# Patient Record
Sex: Female | Born: 1995 | Race: Asian | Hispanic: No | Marital: Married | Smoking: Never smoker
Health system: Southern US, Community
[De-identification: ages and names within clinical notes are randomized; demographics above are authoritative.]

## PROBLEM LIST (undated history)

## (undated) ENCOUNTER — Inpatient Hospital Stay (HOSPITAL_COMMUNITY): Payer: Self-pay

## (undated) DIAGNOSIS — Z789 Other specified health status: Secondary | ICD-10-CM

## (undated) HISTORY — PX: NO PAST SURGERIES: SHX2092

---
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
## 2016-09-26 ENCOUNTER — Ambulatory Visit (INDEPENDENT_AMBULATORY_CARE_PROVIDER_SITE_OTHER): Payer: BLUE CROSS/BLUE SHIELD | Admitting: Physician Assistant

## 2016-09-26 ENCOUNTER — Encounter: Payer: Self-pay | Admitting: Physician Assistant

## 2016-09-26 VITALS — BP 110/71 | HR 82 | Temp 98.1°F | Resp 16 | Wt 116.2 lb

## 2016-09-26 DIAGNOSIS — R300 Dysuria: Secondary | ICD-10-CM | POA: Diagnosis not present

## 2016-09-26 DIAGNOSIS — N390 Urinary tract infection, site not specified: Secondary | ICD-10-CM | POA: Diagnosis not present

## 2016-09-26 DIAGNOSIS — R319 Hematuria, unspecified: Secondary | ICD-10-CM | POA: Diagnosis not present

## 2016-09-26 LAB — POCT URINALYSIS DIP (MANUAL ENTRY)
BILIRUBIN UA: NEGATIVE
BILIRUBIN UA: NEGATIVE mg/dL
Glucose, UA: NEGATIVE mg/dL
Nitrite, UA: POSITIVE — AB
PH UA: 6.5 (ref 5.0–8.0)
Protein Ur, POC: 30 mg/dL — AB
Spec Grav, UA: >=1.03 — AB (ref 1.010–1.025)
Urobilinogen, UA: 0.2 E.U./dL

## 2016-09-26 LAB — POC MICROSCOPIC URINALYSIS (UMFC): Mucus: ABSENT

## 2016-09-26 MED ORDER — NITROFURANTOIN MONOHYD MACRO 100 MG PO CAPS: 100.0000 mg | ORAL_CAPSULE | Freq: Two times a day (BID) | ORAL | 0 refills | Status: AC

## 2016-09-26 MED ORDER — PHENAZOPYRIDINE HCL 200 MG PO TABS: 200.0000 mg | ORAL_TABLET | Freq: Three times a day (TID) | ORAL | 0 refills | Status: DC | PRN

## 2016-09-26 NOTE — Progress Notes (Signed)
09/26/2016 at 9:29 AM  Carrie Boyd / DOB: 09-Jan-1996 / MRN: [id number]  The patient  does not have a problem list on file.  SUBJECTIVE  Carrie Boyd is a 21 y.o. female who complains of dysuria, hematuria, urinary frequency, urinary hesitancy, and suprapubic pressure x 4 weeks. She denies flank pain, pelvic pain, cloudy malordorous urine, genital rash, genital irritation and vaginal discharge. Has not tried anything with no relief. Has never had a UTI.    She  has no past medical history on file.    Medications reviewed and updated by myself where necessary, and exist elsewhere in the encounter.   Carrie Boyd has No Known Allergies. She  reports that she has never smoked. She has never used smokeless tobacco. She reports that she does not drink alcohol or use drugs. She  has no sexual activity history on file. The patient  has no past surgical history on file.  Her family history is not on file.  Review of Systems  Constitutional: Negative for chills, diaphoresis and fever.  Gastrointestinal: Negative for nausea and vomiting.    OBJECTIVE  Her  weight is 116 lb 3.2 oz (52.7 kg). Her oral temperature is 98.1 F (36.7 C). Her blood pressure is 110/71 and her pulse is 82. Her respiration is 16 and oxygen saturation is 100%.  The patient's body mass index is unknown because there is no height or weight on file.  Physical Exam  Constitutional: She is oriented to person, place, and time. She appears well-developed and well-nourished. No distress.  HENT:  Head: Normocephalic and atraumatic.  Eyes: Conjunctivae are normal.  Neck: Normal range of motion.  Respiratory: Effort normal.  GI: Soft. Normal appearance. She exhibits no distension. There is tenderness (mild ) in the suprapubic area. There is no rigidity, no guarding, no tenderness at McBurney's point and negative Murphy's sign.  Neurological: She is alert and oriented to person, place, and time.  Skin: Skin is warm and dry.  Psychiatric: She has  a normal mood and affect.    Results for orders placed or performed in visit on 09/26/16 (from the past 24 hour(s))  POCT urinalysis dipstick     Status: Abnormal   Collection Time: 09/26/16  9:02 AM  Result Value Ref Range   Color, UA yellow yellow   Clarity, UA cloudy (A) clear   Glucose, UA negative negative mg/dL   Bilirubin, UA negative negative   Ketones, POC UA negative negative mg/dL   Spec Grav, UA >=4.098 (A) 1.010 - 1.025   Blood, UA moderate (A) negative   pH, UA 6.5 5.0 - 8.0   Protein Ur, POC =30 (A) negative mg/dL   Urobilinogen, UA 0.2 0.2 or 1.0 E.U./dL   Nitrite, UA Positive (A) Negative   Leukocytes, UA Trace (A) Negative  POCT Microscopic Urinalysis ([facility])     Status: Abnormal   Collection Time: 09/26/16  9:14 AM  Result Value Ref Range   WBC,UR,HPF,POC Many (A) None WBC/hpf   RBC,UR,HPF,POC Moderate (A) None RBC/hpf   Bacteria Many (A) None, Too numerous to count   Mucus Absent Absent   Epithelial Cells, UR Per Microscopy Few (A) None, Too numerous to count cells/hpf    ASSESSMENT & PLAN  Carrie was seen today for dysuria and urinary frequency.  Diagnoses and all orders for this visit:  Dysuria -     POCT urinalysis dipstick -     POCT Microscopic Urinalysis ([facility]) -     Urine Culture -  phenazopyridine (PYRIDIUM) 200 MG tablet; Take 1 tablet (200 mg total) by mouth 3 (three) times daily as needed for pain.  Urinary tract infection with hematuria, site unspecified -     nitrofurantoin, macrocrystal-monohydrate, (MACROBID) 100 MG capsule; Take 1 capsule (100 mg total) by mouth 2 (two) times daily.    The patient was advised to call or come back to clinic if she does not see an improvement in symptoms, or worsens with the above plan.   Benjiman CoreBrittany Attila Mccarthy, PA-C [facility] 09/26/2016 9:29 AM

## 2016-09-26 NOTE — Patient Instructions (Addendum)
Your results indicate you have a UTI. I have given you a prescription for an antibiotic. Please take with food. I have sent off a urine culture and we should have those results in 48 hours. If your symptoms worsen while you are awaiting these results or you develop fever, chills, flank pian, nausea and vomiting, please seek care immediately. I have also given you a tablet to use for the burning. This medication can make your urine turn orange, so do not be alarmed if this happens.    Urinary Tract Infection, Adult A urinary tract infection (UTI) is an infection of any part of the urinary tract. The urinary tract includes the:  Kidneys.  Ureters.  Bladder.  Urethra.  These organs make, store, and get rid of pee (urine) in the body. Follow these instructions at home:  Take over-the-counter and prescription medicines only as told by your doctor.  If you were prescribed an antibiotic medicine, take it as told by your doctor. Do not stop taking the antibiotic even if you start to feel better.  Avoid the following drinks: ? Alcohol. ? Caffeine. ? Tea. ? Carbonated drinks.  Drink enough fluid to keep your pee clear or pale yellow.  Keep all follow-up visits as told by your doctor. This is important.  Make sure to: ? Empty your bladder often and completely. Do not to hold pee for long periods of time. ? Empty your bladder before and after sex. ? Wipe from front to back after a bowel movement if you are female. Use each tissue one time when you wipe. Contact a doctor if:  You have back pain.  You have a fever.  You feel sick to your stomach (nauseous).  You throw up (vomit).  Your symptoms do not get better after 3 days.  Your symptoms go away and then come back. Get help right away if:  You have very bad back pain.  You have very bad lower belly (abdominal) pain.  You are throwing up and cannot keep down any medicines or water. This information is not intended to  replace advice given to you by your health care provider. Make sure you discuss any questions you have with your health care provider. Document Released: 08/20/2007 Document Revised: 08/09/2015 Document Reviewed: 01/22/2015 Elsevier Interactive Patient Education  2018 [employer]   IF you received an x-ray today, you will receive an invoice from [facility]. Please contact [facility] at [phone] with questions or concerns regarding your invoice.   IF you received labwork today, you will receive an invoice from [facility]. Please contact [facility] at [phone] with questions or concerns regarding your invoice.   Our billing staff will not be able to assist you with questions regarding bills from these companies.  You will be contacted with the lab results as soon as they are available. The fastest way to get your results is to activate your My Chart account. Instructions are located on the last page of this paperwork. If you have not heard from us regarding the results in 2 weeks, please contact this office.

## 2016-09-30 LAB — URINE CULTURE

## 2016-11-14 ENCOUNTER — Ambulatory Visit (INDEPENDENT_AMBULATORY_CARE_PROVIDER_SITE_OTHER): Payer: BLUE CROSS/BLUE SHIELD | Admitting: Physician Assistant

## 2016-11-14 ENCOUNTER — Encounter: Payer: Self-pay | Admitting: Physician Assistant

## 2016-11-14 VITALS — BP 107/71 | HR 91 | Temp 97.6°F | Resp 16 | Wt 116.0 lb

## 2016-11-14 DIAGNOSIS — R8299 Other abnormal findings in urine: Secondary | ICD-10-CM

## 2016-11-14 DIAGNOSIS — R319 Hematuria, unspecified: Secondary | ICD-10-CM

## 2016-11-14 DIAGNOSIS — N390 Urinary tract infection, site not specified: Secondary | ICD-10-CM | POA: Diagnosis not present

## 2016-11-14 DIAGNOSIS — R350 Frequency of micturition: Secondary | ICD-10-CM | POA: Diagnosis not present

## 2016-11-14 DIAGNOSIS — R300 Dysuria: Secondary | ICD-10-CM

## 2016-11-14 DIAGNOSIS — R82998 Other abnormal findings in urine: Secondary | ICD-10-CM

## 2016-11-14 LAB — POCT URINALYSIS DIP (MANUAL ENTRY)
Glucose, UA: 100 mg/dL — AB
Nitrite, UA: POSITIVE — AB
Protein Ur, POC: >=300 mg/dL — AB
Spec Grav, UA: >=1.03 — AB (ref 1.010–1.025)
Urobilinogen, UA: 2 U/dL — AB
pH, UA: 6.5 (ref 5.0–8.0)

## 2016-11-14 LAB — POC MICROSCOPIC URINALYSIS (UMFC): Mucus: ABSENT

## 2016-11-14 MED ORDER — NITROFURANTOIN MONOHYD MACRO 100 MG PO CAPS: 100.0000 mg | ORAL_CAPSULE | Freq: Two times a day (BID) | ORAL | 0 refills | Status: DC

## 2016-11-14 NOTE — Progress Notes (Signed)
11/14/2016 at 5:54 PM  Carrie Boyd / DOB: 01-May-1995 / MRN: [id number]  The patient  does not have a problem list on file.  SUBJECTIVE  Carrie Boyd is a 21 y.o. female who complains of dysuria, hematuria, urinary frequency, urinary urgency and suprapubic abdominal pain x 4 days. She denies flank pain and vaginal discharge. Has tried Azo and probiotics with cranberry with no relief. Most recent UTI prior to this was 09/26/2016. Urine culture grew E.coli, which was sensitive to macrobid. Pt is sexually active with monogamous partner. Just started engaging in sexual activity in 02/2016. Never had UTIs prior to this. States she uses the bathroom consistently after engaging in sexual intercourse. Denies use of spermicides or lubricants.   She  has no past medical history on file.    Medications reviewed and updated by myself where necessary, and exist elsewhere in the encounter.   Carrie Boyd has No Known Allergies. She  reports that she has never smoked. She has never used smokeless tobacco. She reports that she does not drink alcohol or use drugs. She  has no sexual activity history on file. The patient  has no past surgical history on file.  Her family history is not on file.  Review of Systems  Constitutional: Negative for chills, diaphoresis and fever.  Gastrointestinal: Negative for nausea and vomiting.    OBJECTIVE  Her  weight is 116 lb (52.6 kg). Her oral temperature is 97.6 F (36.4 C). Her blood pressure is 107/71 and her pulse is 91. Her respiration is 16 and oxygen saturation is 100%.  The patient's body mass index is unknown because there is no height or weight on file.  Physical Exam  Constitutional: She is oriented to person, place, and time. She appears well-developed and well-nourished.  HENT:  Head: Normocephalic and atraumatic.  Eyes: Conjunctivae are normal.  Neck: Normal range of motion.  Pulmonary/Chest: Effort normal.  Abdominal: Soft. Normal appearance. There is tenderness  (mild) in the suprapubic area. There is no CVA tenderness.  Neurological: She is alert and oriented to person, place, and time.  Skin: Skin is warm and dry.  Psychiatric: She has a normal mood and affect.  Vitals reviewed.   Results for orders placed or performed in visit on 11/14/16 (from the past 24 hour(s))  POCT urinalysis dipstick     Status: Abnormal   Collection Time: 11/14/16  3:58 PM  Result Value Ref Range   Color, UA orange (A) yellow   Clarity, UA clear clear   Glucose, UA =100 (A) negative mg/dL   Bilirubin, UA small (A) negative   Ketones, POC UA trace (5) (A) negative mg/dL   Spec Grav, UA >=6.962 (A) 1.010 - 1.025   Blood, UA large (A) negative   pH, UA 6.5 5.0 - 8.0   Protein Ur, POC >=300 (A) negative mg/dL   Urobilinogen, UA 2.0 (A) 0.2 or 1.0 E.U./dL   Nitrite, UA Positive (A) Negative   Leukocytes, UA Large (3+) (A) Negative  POCT Microscopic Urinalysis ([facility])     Status: Abnormal   Collection Time: 11/14/16  4:04 PM  Result Value Ref Range   WBC,UR,HPF,POC Too numerous to count  (A) None WBC/hpf   RBC,UR,HPF,POC Many (A) None RBC/hpf   Bacteria Few (A) None, Too numerous to count   Mucus Absent Absent   Epithelial Cells, UR Per Microscopy Few (A) None, Too numerous to count cells/hpf   ASSESSMENT & PLAN  Carrie was seen today for dysuria and  hematuria.  Diagnoses and all orders for this visit:  Dysuria -     POCT urinalysis dipstick -     POCT Microscopic Urinalysis ([facility]) -     Urine Culture  Urinary frequency  Leukocytes in urine -     Urine Culture  Urinary tract infection with hematuria, site unspecified -     nitrofurantoin, macrocrystal-monohydrate, (MACROBID) 100 MG capsule; Take 1 capsule (100 mg total) by mouth 2 (two) times daily.    Due to hx and POCT, will treat empirically for UTI at this time. Urine culture pending. Last urine cx grew E.coli sensitive to macrobid. Will treat with longer duration of macrobid at this time. Plan to  follow up in 4 days for repeat UA while on medication to ensure appropriate healing. The patient was advised to call or come back to clinic if she does not see an improvement in symptoms, or worsens with the above plan. If pt has one more documented UTI this year, consider prophylactic tx.   Benjiman CoreBrittany Natelie Ostrosky, PA-C  [facility] 11/14/2016 5:57 PM

## 2016-11-14 NOTE — Patient Instructions (Addendum)
Your results indicate you have a UTI. I have given you a prescription for an antibiotic for a longer duration than last time. Please take with food. I have sent off a urine culture and we should have those results in 48 hours. I would like you to return on Tuesday of next week for repeat urinalysis to ensure this is clearing. If your symptoms worsen while you are awaiting these results or you develop fever, chills, flank pian, nausea and vomiting, please seek care immediately. If you have another UTI this year, may consider doing prophylactic treatment Thank you for letting me participate in your health and well being.  Urinary Tract Infection, Adult A urinary tract infection (UTI) is an infection of any part of the urinary tract. The urinary tract includes the:  Kidneys.  Ureters.  Bladder.  Urethra.  These organs make, store, and get rid of pee (urine) in the body. Follow these instructions at home:  Take over-the-counter and prescription medicines only as told by your doctor.  If you were prescribed an antibiotic medicine, take it as told by your doctor. Do not stop taking the antibiotic even if you start to feel better.  Avoid the following drinks: ? Alcohol. ? Caffeine. ? Tea. ? Carbonated drinks.  Drink enough fluid to keep your pee clear or pale yellow.  Keep all follow-up visits as told by your doctor. This is important.  Make sure to: ? Empty your bladder often and completely. Do not to hold pee for long periods of time. ? Empty your bladder before and after sex. ? Wipe from front to back after a bowel movement if you are female. Use each tissue one time when you wipe. Contact a doctor if:  You have back pain.  You have a fever.  You feel sick to your stomach (nauseous).  You throw up (vomit).  Your symptoms do not get better after 3 days.  Your symptoms go away and then come back. Get help right away if:  You have very bad back pain.  You have very bad  lower belly (abdominal) pain.  You are throwing up and cannot keep down any medicines or water. This information is not intended to replace advice given to you by your health care provider. Make sure you discuss any questions you have with your health care provider. Document Released: 08/20/2007 Document Revised: 08/09/2015 Document Reviewed: 01/22/2015 Elsevier Interactive Patient Education  2018 [employer]       IF you received an x-ray today, you will receive an invoice from [facility]. Please contact [facility] at [phone] with questions or concerns regarding your invoice.   IF you received labwork today, you will receive an invoice from [facility]. Please contact [facility] at [phone] with questions or concerns regarding your invoice.   Our billing staff will not be able to assist you with questions regarding bills from these companies.  You will be contacted with the lab results as soon as they are available. The fastest way to get your results is to activate your My Chart account. Instructions are located on the last page of this paperwork. If you have not heard from us regarding the results in 2 weeks, please contact this office.

## 2016-11-18 ENCOUNTER — Ambulatory Visit: Payer: BLUE CROSS/BLUE SHIELD | Admitting: Physician Assistant

## 2016-11-18 ENCOUNTER — Ambulatory Visit (INDEPENDENT_AMBULATORY_CARE_PROVIDER_SITE_OTHER): Payer: BLUE CROSS/BLUE SHIELD | Admitting: Physician Assistant

## 2016-11-18 ENCOUNTER — Encounter: Payer: Self-pay | Admitting: Physician Assistant

## 2016-11-18 VITALS — BP 99/67 | HR 69 | Temp 98.9°F | Resp 16 | Ht 63.0 in | Wt 115.4 lb

## 2016-11-18 DIAGNOSIS — N390 Urinary tract infection, site not specified: Secondary | ICD-10-CM

## 2016-11-18 DIAGNOSIS — N3001 Acute cystitis with hematuria: Secondary | ICD-10-CM | POA: Diagnosis not present

## 2016-11-18 DIAGNOSIS — R319 Hematuria, unspecified: Secondary | ICD-10-CM

## 2016-11-18 DIAGNOSIS — R300 Dysuria: Secondary | ICD-10-CM | POA: Diagnosis not present

## 2016-11-18 DIAGNOSIS — Z202 Contact with and (suspected) exposure to infections with a predominantly sexual mode of transmission: Secondary | ICD-10-CM

## 2016-11-18 LAB — POCT URINALYSIS DIP (MANUAL ENTRY)
Bilirubin, UA: NEGATIVE
Blood, UA: NEGATIVE
Glucose, UA: NEGATIVE mg/dL
Ketones, POC UA: NEGATIVE mg/dL
Nitrite, UA: NEGATIVE
Protein Ur, POC: NEGATIVE mg/dL
Spec Grav, UA: 1.02 (ref 1.010–1.025)
Urobilinogen, UA: 0.2 U/dL
pH, UA: 7 (ref 5.0–8.0)

## 2016-11-18 LAB — URINE CULTURE

## 2016-11-18 MED ORDER — NITROFURANTOIN MONOHYD MACRO 100 MG PO CAPS: 100.0000 mg | ORAL_CAPSULE | Freq: Two times a day (BID) | ORAL | 0 refills | Status: AC

## 2016-11-18 NOTE — Progress Notes (Signed)
Carrie Boyd  MRN: [id number] DOB: [birth year]  PCP: Patient, No Pcp Per  Subjective:  Pt is a 21 year old female who presents to clinic for f/u UTI.  She has been treated for two UTI's in the past month.  Initial UTI was 09/26/2016 - HPI from this OV: urine culture grew E.coli, which was sensitive to macrobid. Pt is sexually active with monogamous partner. Just started engaging in sexual activity in 02/2016. Never had UTIs prior to this. States she uses the bathroom consistently after engaging in sexual intercourse. Denies use of spermicides or lubricants.  Today she is still taking her Macrobid - 2 days left. She is starting to feel better. C/o "bladder hurt a little bit" yesterday.  She does not drink much water throughout the day. She holds her urine while at work - she works as a Advertising account planner.  She and her boyfriend recently broke up for about 3 weeks. She is unsure whether he "slept around". Denies vaginal discharge, vaginal burning or itching, flank pain, low back pain, hematuria, increased frequency.   Review of Systems  Constitutional: Negative for chills, fatigue and fever.  Gastrointestinal: Positive for abdominal pain.  Genitourinary: Positive for dysuria. Negative for decreased urine volume, difficulty urinating, enuresis, flank pain, frequency, hematuria and urgency.  Musculoskeletal: Negative for back pain.    There are no active problems to display for this patient.   Current Outpatient Prescriptions on File Prior to Visit  Medication Sig Dispense Refill  . nitrofurantoin, macrocrystal-monohydrate, (MACROBID) 100 MG capsule Take 1 capsule (100 mg total) by mouth 2 (two) times daily. 14 capsule 0   No current facility-administered medications on file prior to visit.     No Known Allergies   Objective:  BP 101/67   Pulse 66   Temp 98.9 F (37.2 C) (Oral)   Resp 16   Ht 5\' 3"  (1.6 m)   Wt 115 lb 6.4 oz (52.3 kg)   LMP 11/01/2016   SpO2 99%   BMI 20.44 kg/m    Physical Exam  Constitutional: She is oriented to person, place, and time and well-developed, well-nourished, and in no distress. No distress.  Cardiovascular: Normal rate, regular rhythm and normal heart sounds.   Abdominal: Soft. Normal appearance. There is no tenderness. There is no CVA tenderness.  Neurological: She is alert and oriented to person, place, and time. GCS score is 15.  Skin: Skin is warm and dry.  Psychiatric: Mood, memory, affect and judgment normal.  Vitals reviewed.  Results for orders placed or performed in visit on 11/18/16  POCT urinalysis dipstick  Result Value Ref Range   Color, UA yellow yellow   Clarity, UA cloudy (A) clear   Glucose, UA negative negative mg/dL   Bilirubin, UA negative negative   Ketones, POC UA negative negative mg/dL   Spec Grav, UA 4.098 1.191 - 1.025   Blood, UA negative negative   pH, UA 7.0 5.0 - 8.0   Protein Ur, POC negative negative mg/dL   Urobilinogen, UA 0.2 0.2 or 1.0 E.U./dL   Nitrite, UA Negative Negative   Leukocytes, UA Trace (A) Negative    Assessment and Plan :  1. Dysuria 2. Urinary tract infection with hematuria, site unspecified - POCT urinalysis dipstick - Urine Culture - nitrofurantoin, macrocrystal-monohydrate, (MACROBID) 100 MG capsule; Take 1 capsule (100 mg total) by mouth 2 (two) times daily.  Dispense: 8 capsule; Refill: 0 - Pt has h/o 2 UTI's within the past month. She is here  today for f/u from UTI 4 days ago. She is feeling mostly better. UA shows trace leukocytes. Will extend her antibiotics a few more days. As per PA Brittany's last note: If pt has one more documented UTI this year, consider prophylactic tx. This was discussed with pt. She understands and will RTC if she develops UTI symptoms again.  3. Possible exposure to STD - GC/Chlamydia Probe Amp - HIV antibody (with reflex) - Labs are pending. Will contact with results and plan. Safe sex practices advised.   Marco CollieWhitney Aleksa Collinsworth, PA-C  Primary  Care at [facility] 11/18/2016 10:07 AM

## 2016-11-18 NOTE — Patient Instructions (Addendum)
Take 4 more days of your antibiotic. Take this as prescribed.  Drink 1-2 liters of water daily. Please see instructions below to help reduce your likelihood for UTI.  Start taking a probiotic daily.  Continue urinating after you have intercourse.  Come back if your symptoms return. We will likely start a prophylactic antibiotic at that time.   Thank you for coming in today. I hope you feel we met your needs.  Feel free to call PCP  if you have any questions or further requests.  Please consider signing up for MyChart if you do not already have it, as this is a great way to communicate with me.  Best,  Whitney McVey, PA-C    Urinary Tract Infection, Adult A urinary tract infection (UTI) is an infection of any part of the urinary tract, which includes the kidneys, ureters, bladder, and urethra. These organs make, store, and get rid of urine in the body. UTI can be a bladder infection (cystitis) or kidney infection (pyelonephritis). What are the causes? This infection may be caused by fungi, viruses, or bacteria. Bacteria are the most common cause of UTIs. This condition can also be caused by repeated incomplete emptying of the bladder during urination. What increases the risk? This condition is more likely to develop if:  You ignore your need to urinate or hold urine for long periods of time.  You do not empty your bladder completely during urination.  You wipe back to front after urinating or having a bowel movement, if you are female.  You are uncircumcised, if you are female.  You are constipated.  You have a urinary catheter that stays in place (indwelling).  You have a weak defense (immune) system.  You have a medical condition that affects your bowels, kidneys, or bladder.  You have diabetes.  You take antibiotic medicines frequently or for long periods of time, and the antibiotics no longer work well against certain types of infections (antibiotic resistance).  You take  medicines that irritate your urinary tract.  You are exposed to chemicals that irritate your urinary tract.  You are female.  What are the signs or symptoms? Symptoms of this condition include:  Fever.  Frequent urination or passing small amounts of urine frequently.  Needing to urinate urgently.  Pain or burning with urination.  Urine that smells bad or unusual.  Cloudy urine.  Pain in the lower abdomen or back.  Trouble urinating.  Blood in the urine.  Vomiting or being less hungry than normal.  Diarrhea or abdominal pain.  Vaginal discharge, if you are female.  How is this diagnosed? This condition is diagnosed with a medical history and physical exam. You will also need to provide a urine sample to test your urine. Other tests may be done, including:  Blood tests.  Sexually transmitted disease (STD) testing.  If you have had more than one UTI, a cystoscopy or imaging studies may be done to determine the cause of the infections. How is this treated? Treatment for this condition often includes a combination of two or more of the following:  Antibiotic medicine.  Other medicines to treat less common causes of UTI.  Over-the-counter medicines to treat pain.  Drinking enough water to stay hydrated.  Follow these instructions at home:  Take over-the-counter and prescription medicines only as told by your health care provider.  If you were prescribed an antibiotic, take it as told by your health care provider. Do not stop taking the antibiotic even  if you start to feel better.  Avoid alcohol, caffeine, tea, and carbonated beverages. They can irritate your bladder.  Drink enough fluid to keep your urine clear or pale yellow.  Keep all follow-up visits as told by your health care provider. This is important.  Make sure to: ? Empty your bladder often and completely. Do not hold urine for long periods of time. ? Empty your bladder before and after  sex. ? Wipe from front to back after a bowel movement if you are female. Use each tissue one time when you wipe. Contact a health care provider if:  You have back pain.  You have a fever.  You feel nauseous or vomit.  Your symptoms do not get better after 3 days.  Your symptoms go away and then return. Get help right away if:  You have severe back pain or lower abdominal pain.  You are vomiting and cannot keep down any medicines or water. This information is not intended to replace advice given to you by your health care provider. Make sure you discuss any questions you have with your health care provider. Document Released: 12/11/2004 Document Revised: 08/15/2015 Document Reviewed: 01/22/2015 Elsevier Interactive Patient Education  2017 [employer].  IF you received an x-ray today, you will receive an invoice from [facility]. Please contact [facility] at [phone] with questions or concerns regarding your invoice.   IF you received labwork today, you will receive an invoice from [facility]. Please contact [facility] at [phone] with questions or concerns regarding your invoice.   Our billing staff will not be able to assist you with questions regarding bills from these companies.  You will be contacted with the lab results as soon as they are available. The fastest way to get your results is to activate your My Chart account. Instructions are located on the last page of this paperwork. If you have not heard from [place] regarding the results in 2 weeks, please contact this office.

## 2016-11-19 LAB — HIV ANTIBODY (ROUTINE TESTING W REFLEX): HIV Screen 4th Generation wRfx: NONREACTIVE

## 2016-11-19 LAB — GC/CHLAMYDIA PROBE AMP
Chlamydia trachomatis, NAA: NEGATIVE
Neisseria gonorrhoeae by PCR: NEGATIVE

## 2016-11-19 LAB — URINE CULTURE

## 2016-11-19 NOTE — Progress Notes (Signed)
Pls call pt and let her know she is negative for GC/chlamydia and HIV.  Thank you!

## 2016-11-24 ENCOUNTER — Encounter: Payer: Self-pay | Admitting: *Deleted

## 2016-12-01 ENCOUNTER — Telehealth: Payer: Self-pay | Admitting: Family Medicine

## 2016-12-01 NOTE — Telephone Encounter (Signed)
PT CALLING ABOUT LABS I GAVE HER THE RESULTS OF ALL BEING NORMAL PT UNDERSTANDS

## 2017-02-03 ENCOUNTER — Ambulatory Visit: Payer: BLUE CROSS/BLUE SHIELD | Admitting: Physician Assistant

## 2017-02-03 ENCOUNTER — Encounter: Payer: Self-pay | Admitting: Physician Assistant

## 2017-02-03 ENCOUNTER — Other Ambulatory Visit: Payer: Self-pay

## 2017-02-03 VITALS — BP 98/70 | HR 79 | Temp 98.5°F | Resp 16 | Ht 62.75 in | Wt 117.2 lb

## 2017-02-03 DIAGNOSIS — R6884 Jaw pain: Secondary | ICD-10-CM | POA: Diagnosis not present

## 2017-02-03 DIAGNOSIS — M26622 Arthralgia of left temporomandibular joint: Secondary | ICD-10-CM

## 2017-02-03 DIAGNOSIS — G4763 Sleep related bruxism: Secondary | ICD-10-CM | POA: Diagnosis not present

## 2017-02-03 MED ORDER — NAPROXEN 500 MG PO TABS: 500.0000 mg | ORAL_TABLET | Freq: Two times a day (BID) | ORAL | 0 refills | Status: DC

## 2017-02-03 MED ORDER — CYCLOBENZAPRINE HCL 10 MG PO TABS: 10.0000 mg | ORAL_TABLET | Freq: Three times a day (TID) | ORAL | 0 refills | Status: DC | PRN

## 2017-02-03 NOTE — Progress Notes (Signed)
   Carrie Boyd  MRN: [id number] DOB: 04/22/1995  PCP: Patient, No Pcp Per  Subjective:  Pt is a 21 year old female who presents to clinic for jaw pain x 5 days. Pain is on the left side in front of her ear. Radiates to her ear and toward her cheek.  Her boyfriend has heard her grind her teeth at night. Endorses pain with chewing.  She has not taken anything to feel better. This is a new problem for her. Denies drainage from mouth/teeth or ear, fever, chills, n/v, jaw locking up.   Review of Systems  Constitutional: Negative for chills, diaphoresis, fatigue and fever.  HENT: Positive for ear pain. Negative for drooling, ear discharge, facial swelling and sore throat.   Gastrointestinal: Negative for nausea and vomiting.  Musculoskeletal: Positive for arthralgias (left jaw). Negative for joint swelling.  Skin: Negative.     There are no active problems to display for this patient.   No current outpatient medications on file prior to visit.   No current facility-administered medications on file prior to visit.     No Known Allergies   Objective:  BP 98/70   Pulse 79   Temp 98.5 F (36.9 C) (Oral)   Resp 16   Ht 5' 2.75" (1.594 m)   Wt 117 lb 3.2 oz (53.2 kg)   LMP 01/03/2017   SpO2 97%   BMI 20.93 kg/m   Physical Exam  Constitutional: She is oriented to person, place, and time and well-developed, well-nourished, and in no distress. No distress.  HENT:  Head:    Right Ear: Tympanic membrane normal.  Left Ear: Tympanic membrane normal.  Mouth/Throat: Oropharynx is clear and moist and mucous membranes are normal. Normal dentition. No dental caries.    Neurological: She is alert and oriented to person, place, and time. GCS score is 15.  Skin: Skin is warm and dry.  Psychiatric: Mood, memory, affect and judgment normal.  Vitals reviewed.   Assessment and Plan :  1. TMJ tenderness, left 2. Bruxism, sleep-related 3. Jaw pain - naproxen (NAPROSYN) 500 MG tablet;  Take 1 tablet (500 mg total) by mouth 2 (two) times daily with a meal.  Dispense: 30 tablet; Refill: 0 - cyclobenzaprine (FLEXERIL) 10 MG tablet; Take 1 tablet (10 mg total) by mouth 3 (three) times daily as needed for muscle spasms.  Dispense: 30 tablet; Refill: 0 - Suspect jaw pain 2/2 nocturnal bruxism. Discussed need to stop grinding her teeth at night. Consider mouth guard and/or OV with dentist for custom guard. Advised stress releiving techniques. RTC in 2-3 weeks if no improvement. Consider tricyclic antidepressant. She understands and agrees with plan.   Marco CollieWhitney Nahsir Venezia, PA-C  Primary Care at [facility] 02/03/2017 9:53 AM

## 2017-02-03 NOTE — Patient Instructions (Addendum)
Take Flexeril and Naprosyn together. Flexeril is a muscle relaxer and Naprosyn is an antiinflammatory NSAID - Do not use with any other otc pain medication other than tylenol/acetaminophen - so no aleve, ibuprofen, motrin, advil, etc.  Consider making an appointment with your dentist to discuss this condition. You may need a custom fitted mouth guard for sleep.  Consider stress relieving techniques. TMJ is often related to stress.  Come back in 2-3 weeks if you are not improving. We can try a different medication.   Make an appointment with [place] or myself to discuss your concern about your skin. We can run some tests and/or try treatment.   Temporomandibular Joint Syndrome Temporomandibular joint (TMJ) syndrome is a condition that affects the joints between your jaw and your skull. The TMJs are located near your ears and allow your jaw to open and close. These joints and the nearby muscles are involved in all movements of the jaw. People with TMJ syndrome have pain in the area of these joints and muscles. Chewing, biting, or other movements of the jaw can be difficult or painful. TMJ syndrome can be caused by various things. In many cases, the condition is mild and goes away within a few weeks. For some people, the condition can become a long-term problem. What are the causes? Possible causes of TMJ syndrome include:  Grinding your teeth or clenching your jaw. Some people do this when they are under stress.  Arthritis.  Injury to the jaw.  Head or neck injury.  Teeth or dentures that are not aligned well.  In some cases, the cause of TMJ syndrome may not be known. What are the signs or symptoms? The most common symptom is an aching pain on the side of the head in the area of the TMJ. Other symptoms may include:  Pain when moving your jaw, such as when chewing or biting.  Being unable to open your jaw all the way.  Making a clicking sound when you open your  mouth.  Headache.  Earache.  Neck or shoulder pain.  How is this diagnosed? Diagnosis can usually be made based on your symptoms, your medical history, and a physical exam. Your health care provider may check the range of motion of your jaw. Imaging tests, such as X-rays or an MRI, are sometimes done. You may need to see your dentist to determine if your teeth and jaw are lined up correctly. How is this treated? TMJ syndrome often goes away on its own. If treatment is needed, the options may include:  Eating soft foods and applying ice or heat.  Medicines to relieve pain or inflammation.  Medicines to relax the muscles.  A splint, bite plate, or mouthpiece to prevent teeth grinding or jaw clenching.  Relaxation techniques or counseling to help reduce stress.  Transcutaneous electrical nerve stimulation (TENS). This helps to relieve pain by applying an electrical current through the skin.  Acupuncture. This is sometimes helpful to relieve pain.  Jaw surgery. This is rarely needed.  Follow these instructions at home:  Take medicines only as directed by your health care provider.  Eat a soft diet if you are having trouble chewing.  Apply ice to the painful area. ? Put ice in a plastic bag. ? Place a towel between your skin and the bag. ? Leave the ice on for 20 minutes, 2-3 times a day.  Apply a warm compress to the painful area as directed.  Massage your jaw area and perform any  jaw stretching exercises as recommended by your health care provider.  If you were given a mouthpiece or bite plate, wear it as directed.  Avoid foods that require a lot of chewing. Do not chew gum.  Keep all follow-up visits as directed by your health care provider. This is important. Contact a health care provider if:  You are having trouble eating.  You have new or worsening symptoms. Get help right away if:  Your jaw locks open or closed. This information is not intended to replace  advice given to you by your health care provider. Make sure you discuss any questions you have with your health care provider. Document Released: 11/26/2000 Document Revised: 11/01/2015 Document Reviewed: 10/06/2013 Elsevier Interactive Patient Education  [employer].  Thank you for coming in today. I hope you feel we met your needs.  Feel free to call PCP if you have any questions or further requests.  Please consider signing up for MyChart if you do not already have it, as this is a great way to communicate with me.  Best,  Whitney McVey, PA-C   IF you received an x-ray today, you will receive an invoice from [facility]. Please contact [facility] at [phone] with questions or concerns regarding your invoice.   IF you received labwork today, you will receive an invoice from [place]. Please contact [facility] at [phone] with questions or concerns regarding your invoice.   Our billing staff will not be able to assist you with questions regarding bills from these companies.  You will be contacted with the lab results as soon as they are available. The fastest way to get your results is to activate your My Chart account. Instructions are located on the last page of this paperwork. If you have not heard from [place] regarding the results in 2 weeks, please contact this office.

## 2017-02-23 ENCOUNTER — Ambulatory Visit: Payer: BLUE CROSS/BLUE SHIELD | Admitting: Physician Assistant

## 2017-03-02 ENCOUNTER — Ambulatory Visit: Payer: BLUE CROSS/BLUE SHIELD | Admitting: Physician Assistant

## 2017-03-02 ENCOUNTER — Other Ambulatory Visit: Payer: Self-pay

## 2017-03-02 ENCOUNTER — Encounter: Payer: Self-pay | Admitting: Physician Assistant

## 2017-03-02 VITALS — BP 108/60 | HR 93 | Temp 98.0°F | Resp 18 | Ht 63.39 in | Wt 114.4 lb

## 2017-03-02 DIAGNOSIS — R21 Rash and other nonspecific skin eruption: Secondary | ICD-10-CM | POA: Diagnosis not present

## 2017-03-02 DIAGNOSIS — B360 Pityriasis versicolor: Secondary | ICD-10-CM

## 2017-03-02 DIAGNOSIS — N926 Irregular menstruation, unspecified: Secondary | ICD-10-CM | POA: Diagnosis not present

## 2017-03-02 LAB — POCT SKIN KOH: Skin KOH, POC: NEGATIVE

## 2017-03-02 LAB — POCT URINE PREGNANCY: PREG TEST UR: NEGATIVE

## 2017-03-02 MED ORDER — SELENIUM SULFIDE 2.25 % EX SHAM: 1.0000 "application " | MEDICATED_SHAMPOO | Freq: Every day | CUTANEOUS | 0 refills | Status: DC

## 2017-03-02 NOTE — Progress Notes (Deleted)
Subjective:     Carrie Boyd is a 21 y.o. female who presents for evaluation of a rash involving the back. Rash started 6 days ago when she got here from [place]. Lesions are white and flaky in texture. Rash has changed over time. Rash is pruritic. Associated symptoms: none. Patient denies: abdominal pain, arthralgia, congestion, cough, decrease in appetite, decrease in energy level, fever, headache, irritability, myalgia, nausea, sore throat and vomiting. Patient has not had contacts with similar rash. Patient has not had new exposures (soaps, lotions, laundry detergents, foods, medications, plants, insects or animals).  {Common ambulatory SmartLinks:19316}  Review of Systems {ros; complete:30496}    Objective:    BP 108/60 (BP Location: Left Arm, Patient Position: Sitting, Cuff Size: Normal)   Pulse 93   Temp 98 F (36.7 C) (Oral)   Resp 18   Ht 5' 3.39" (1.61 m)   Wt 114 lb 6.4 oz (51.9 kg)   LMP 02/02/2017 (Approximate)   SpO2 99%   BMI 20.02 kg/m  General:  {gen appearance:16600}  Skin:  {skin exam:30902::"normal"}     Assessment:    {derm diagnosis:16511}    Plan:    {ZOXW:96045}{plan:18774}

## 2017-03-02 NOTE — Progress Notes (Signed)
Carrie Boyd  MRN: [id number] DOB: 04/06/1995  Subjective:     Carrie Boyd is a 21 y.o. female who presents for evaluation of a rash involving the back. Rash started 6 days ago when she got here from [place]. Lesions are white and flaky in texture. Rash has changed over time. Rash is pruritic. Associated symptoms: none. Patient denies: abdominal pain, arthralgia, congestion, cough, decrease in appetite, decrease in energy level, fever, headache, irritability, myalgia, nausea, sore throat and vomiting. Patient has not had contacts with similar rash. Patient has not had new exposures (soaps, lotions, laundry detergents, foods, medications, plants, insects or animals).  Patient would also like a pregnancy test.  Notes that she is a few days late on her cycle and she wants to make sure that she is not pregnant.  She is sexually active.  She is not on any birth control.  Has no other questions or concerns.  Review of Systems  Per HPI  There are no active problems to display for this patient.   Current Outpatient Medications on File Prior to Visit  Medication Sig Dispense Refill  . cyclobenzaprine (FLEXERIL) 10 MG tablet Take 1 tablet (10 mg total) by mouth 3 (three) times daily as needed for muscle spasms. 30 tablet 0  . naproxen (NAPROSYN) 500 MG tablet Take 1 tablet (500 mg total) by mouth 2 (two) times daily with a meal. 30 tablet 0   No current facility-administered medications on file prior to visit.     No Known Allergies      Objective:  BP 108/60 (BP Location: Left Arm, Patient Position: Sitting, Cuff Size: Normal)   Pulse 93   Temp 98 F (36.7 C) (Oral)   Resp 18   Ht 5' 3.39" (1.61 m)   Wt 114 lb 6.4 oz (51.9 kg)   LMP 02/02/2017 (Approximate)   SpO2 99%   BMI 20.02 kg/m   Physical Exam  Constitutional: She is oriented to person, place, and time and well-developed, well-nourished, and in no distress.  HENT:  Head: Normocephalic and atraumatic.  Eyes:  Conjunctivae are normal.  Neck: Normal range of motion.  Pulmonary/Chest: Effort normal.  Neurological: She is alert and oriented to person, place, and time. Gait normal.  Skin: Skin is warm and dry. Rash ( Hypopigmented macules and patches noted on anterior and posterior trunk and bilateral upper extremities. Mild scaling over some lesions. No overylying erythema. No face involvement.  ) noted.  Psychiatric: Affect normal.  Vitals reviewed.    Results for orders placed or performed in visit on 03/02/17 (from the past 24 hour(s))  POCT Skin KOH     Status: None   Collection Time: 03/02/17  5:38 PM  Result Value Ref Range   Skin KOH, POC Negative Negative  POCT urine pregnancy     Status: Normal   Collection Time: 03/02/17  6:00 PM  Result Value Ref Range   Preg Test, Ur Negative Negative    Assessment and Plan :  1. Rash and nonspecific skin eruption - POCT Skin KOH 2. Tinea versicolor History and physical exam findings consistent with tinea versicolor.  We will treat empirically with selenium sulfide shampoo at this time. Patient encouraged to return to clinic if symptoms worsen, do not improve, or as needed - Selenium Sulfide 2.25 % SHAM; Apply 1 application topically daily. Apply shampoo to affected area and lather with small amounts of water; leave on skin for 10 minutes, then rinse thoroughly; repeat once every  day for 7 days.  Dispense: 180 mL; Refill: 0  3. Late menstruation Pregnancy test negative.  Patient is interested in considering birth control.  Would like to think about options.  She would like to make a follow-up appointment with me later this week after she has had some time to think about this. Strongly encouraged her to return office for this discussion. - POCT urine pregnancy  Benjiman CoreBrittany Elick Aguilera PA-C  [facility] Care at [facility]  [facility] 03/02/2017 7:00 PM

## 2017-03-02 NOTE — Progress Notes (Deleted)
   Carrie Boyd  MRN: [id number] DOB: 05/01/1995  Subjective:  Carrie Boyd is a 21 y.o. female seen in office today for a chief complaint of ***  Review of Systems  There are no active problems to display for this patient.   Current Outpatient Medications on File Prior to Visit  Medication Sig Dispense Refill  . cyclobenzaprine (FLEXERIL) 10 MG tablet Take 1 tablet (10 mg total) by mouth 3 (three) times daily as needed for muscle spasms. 30 tablet 0  . naproxen (NAPROSYN) 500 MG tablet Take 1 tablet (500 mg total) by mouth 2 (two) times daily with a meal. 30 tablet 0   No current facility-administered medications on file prior to visit.     No Known Allergies   Objective:  There were no vitals taken for this visit.  Physical Exam  Assessment and Plan :  *** There are no diagnoses linked to this encounter.   Benjiman CoreBrittany Wiseman PA-C  Primary Care at [facility]  [facility] 03/02/2017 5:02 PM

## 2017-03-02 NOTE — Patient Instructions (Addendum)
Your history and physical exam findings are consistent with tinea versicolor.  We are going to treat this with a topical shampoo.  Below the instructions on how to do so.  Selenium shampoo (2.25%, 2.3%): Apply to affected area and lather with small amounts of water; leave on skin for 10 minutes, then rinse thoroughly; repeat once every day for 7 days. Tinea Versicolor Tinea versicolor is a skin infection that is caused by a type of yeast. It causes a rash that shows up as light or dark patches on the skin. It often occurs on the chest, back, neck, or upper arms. The condition usually does not cause other problems. In most cases, it goes away in a few weeks with treatment. The infection cannot be spread by person to another person. Follow these instructions at home:  Take medicines only as told by your doctor.  Scrub your skin every day with a dandruff shampoo as told by your doctor.  Do not scratch your skin in the rash area.  Avoid places that are hot and humid.  Do not use tanning booths.  Try to avoid sweating a lot. Contact a doctor if:  Your symptoms get worse.  You have a fever.  You have redness, swelling, or pain in the area of your rash.  You have fluid, blood, or pus coming from your rash.  Your rash comes back after treatment. This information is not intended to replace advice given to you by your health care provider. Make sure you discuss any questions you have with your health care provider. Document Released: 02/14/2008 Document Revised: 11/04/2015 Document Reviewed: 12/13/2013 Elsevier Interactive Patient Education  2018 [employer]    IF you received an x-ray today, you will receive an invoice from [facility]. Please contact [facility] at [phone] with questions or concerns regarding your invoice.   IF you received labwork today, you will receive an invoice from [facility]. Please contact [facility] at [phone] with questions or  concerns regarding your invoice.   Our billing staff will not be able to assist you with questions regarding bills from these companies.  You will be contacted with the lab results as soon as they are available. The fastest way to get your results is to activate your My Chart account. Instructions are located on the last page of this paperwork. If you have not heard from us regarding the results in 2 weeks, please contact this office.

## 2017-03-17 NOTE — L&D Delivery Note (Signed)
Operative Delivery Note At 7:49 PM a viable female was delivered via Vaginal, Spontaneous.  Presentation: vertex; Position: Left,, Occiput,, Anterior; Station: +3.  Verbal consent: obtained from family.  Risks and benefits discussed in detail.  Risks include, but are not limited to the risks of anesthesia, bleeding, infection, damage to maternal tissues, fetal cephalhematoma.  There is also the risk of inability to effect vaginal delivery of the head, or shoulder dystocia that cannot be resolved by established maneuvers, leading to the need for emergency cesarean section.  Vacuum assistance offered due to minimal descent due to poor maternal pushing effort over 2+ hours.  Kiwi used, able to bring vtx from +2 to +3, but had 3 popoffs so initially not reapplied.  She then pushed for about 20 more minutes and made some progress.  With significant caput showing, I reapplied Kiwi one last time and delivered the head with minimal effort.  NICU team present due to meconium, baby had spontaneous cry.  APGAR: 8, 9; weight pending .   Placenta status: spontaneous, intact.   Cord:  with the following complications: none.   Anesthesia:  Epidural Instruments: Kiwi Episiotomy: None Lacerations: 2nd degree Suture Repair: 3.0 vicryl rapide Est. Blood Loss (mL):    Mom to postpartum.  Baby to Couplet care / Skin to Skin.  Leighton Roachodd D Rayn Shorb 10/30/2017, 8:16 PM

## 2017-03-18 ENCOUNTER — Ambulatory Visit: Payer: BLUE CROSS/BLUE SHIELD | Admitting: Physician Assistant

## 2017-03-18 ENCOUNTER — Encounter: Payer: Self-pay | Admitting: Physician Assistant

## 2017-03-18 ENCOUNTER — Other Ambulatory Visit: Payer: Self-pay

## 2017-03-18 VITALS — BP 118/62 | HR 99 | Temp 98.1°F | Resp 18 | Ht 63.39 in | Wt 111.0 lb

## 2017-03-18 DIAGNOSIS — B360 Pityriasis versicolor: Secondary | ICD-10-CM | POA: Diagnosis not present

## 2017-03-18 DIAGNOSIS — Z3201 Encounter for pregnancy test, result positive: Secondary | ICD-10-CM

## 2017-03-18 DIAGNOSIS — R300 Dysuria: Secondary | ICD-10-CM | POA: Diagnosis not present

## 2017-03-18 DIAGNOSIS — R05 Cough: Secondary | ICD-10-CM | POA: Diagnosis not present

## 2017-03-18 DIAGNOSIS — R059 Cough, unspecified: Secondary | ICD-10-CM

## 2017-03-18 DIAGNOSIS — R112 Nausea with vomiting, unspecified: Secondary | ICD-10-CM

## 2017-03-18 DIAGNOSIS — N926 Irregular menstruation, unspecified: Secondary | ICD-10-CM

## 2017-03-18 LAB — POCT CBC
GRANULOCYTE PERCENT: 81 % — AB (ref 37–80)
HEMATOCRIT: 42.1 % (ref 37.7–47.9)
Hemoglobin: 13.3 g/dL (ref 12.2–16.2)
Lymph, poc: 1.5 (ref 0.6–3.4)
MCH, POC: 23.7 pg — AB (ref 27–31.2)
MCHC: 31.6 g/dL — AB (ref 31.8–35.4)
MCV: 75.2 fL — AB (ref 80–97)
MID (CBC): 0.3 (ref 0–0.9)
MPV: 9 fL (ref 0–99.8)
POC GRANULOCYTE: 7.5 — AB (ref 2–6.9)
POC LYMPH PERCENT: 15.8 %L (ref 10–50)
POC MID %: 3.2 % (ref 0–12)
Platelet Count, POC: 228 10*3/uL (ref 142–424)
RBC: 5.6 M/uL — AB (ref 4.04–5.48)
RDW, POC: 15 %
WBC: 9.3 10*3/uL (ref 4.6–10.2)

## 2017-03-18 LAB — POCT URINE PREGNANCY: PREG TEST UR: POSITIVE — AB

## 2017-03-18 LAB — POCT URINALYSIS DIP (MANUAL ENTRY)
Blood, UA: NEGATIVE
GLUCOSE UA: NEGATIVE mg/dL
LEUKOCYTES UA: NEGATIVE
Nitrite, UA: NEGATIVE
Protein Ur, POC: 100 mg/dL — AB
Urobilinogen, UA: 0.2 E.U./dL
pH, UA: 6 (ref 5.0–8.0)

## 2017-03-18 LAB — POC INFLUENZA A&B (BINAX/QUICKVUE)
Influenza A, POC: NEGATIVE
Influenza B, POC: NEGATIVE

## 2017-03-18 MED ORDER — DOXYLAMINE-PYRIDOXINE 10-10 MG PO TBEC: DELAYED_RELEASE_TABLET | ORAL | 0 refills | Status: DC

## 2017-03-18 MED ORDER — VITAMIN B-6 25 MG PO TABS: 25.0000 mg | ORAL_TABLET | Freq: Every day | ORAL | 0 refills | Status: DC

## 2017-03-18 MED ORDER — SODIUM CHLORIDE 0.9 % IV BOLUS (SEPSIS)
1000.0000 mL | Freq: Once | INTRAVENOUS | Status: AC
  Administered 2017-03-18: 1000 mL via INTRAVENOUS

## 2017-03-18 MED ORDER — DOXYLAMINE SUCCINATE (SLEEP) 25 MG PO TABS: 12.5000 mg | ORAL_TABLET | Freq: Every evening | ORAL | 0 refills | Status: DC | PRN

## 2017-03-18 MED ORDER — SELENIUM SULFIDE 2.25 % EX SHAM: 1.0000 "application " | MEDICATED_SHAMPOO | Freq: Every day | CUTANEOUS | 0 refills | Status: DC

## 2017-03-18 NOTE — Patient Instructions (Addendum)
Your pregnancy test did show that you are pregnant.  This is likely why you are so nauseous.  Below is some information about morning sickness.  I have given you a prescription for oral tablets to take for nausea and vomiting.  If you take this and are still having nausea vomiting that does not allow you to tolerate oral liquids or food, seek care immediately at the ED.  I have also given you a referral for gynecology to establish care.  If you are wanting to be seen at the health department, please contact them immediately and let them know that you are pregnant.  Otherwise follow-up with the gynecologist in town.  I also recommend you start a prenatal vitamin.  He can get this over-the-counter at any pharmacy. Daily prenatal vitamins containing folate (400 micrograms/day) are recommended as tolerated throughout pregnancy, and at least through the first 3 months of pregnancy. Ideally, women should start folate supplementation 12 weeks prior to conception. Selected presentations, such as a prior pregnancy complicated by fetal neural tube defect, require higher-dose folate intake of 4 mg/day. When started preconceptually and continued through pregnancy, folic acid supplementation appears to reduce risk of small-for-gestational-age neonates at birth   Morning Sickness Morning sickness is when you feel sick to your stomach (nauseous) during pregnancy. You may feel sick to your stomach and throw up (vomit). You may feel sick in the morning, but you can feel this way any time of day. Some women feel very sick to their stomach and cannot stop throwing up (hyperemesis gravidarum). Follow these instructions at home:  Only take medicines as told by your doctor.  Take multivitamins as told by your doctor. Taking multivitamins before getting pregnant can stop or lessen the harshness of morning sickness.  Eat dry toast or unsalted crackers before getting out of bed.  Eat 5 to 6 small meals a day.  Eat dry and  bland foods like rice and baked potatoes.  Do not drink liquids with meals. Drink between meals.  Do not eat greasy, fatty, or spicy foods.  Have someone cook for you if the smell of food causes you to feel sick or throw up.  If you feel sick to your stomach after taking prenatal vitamins, take them at night or with a snack.  Eat protein when you need a snack (nuts, yogurt, cheese).  Eat unsweetened gelatins for dessert.  Wear a bracelet used for sea sickness (acupressure wristband).  Go to a doctor that puts thin needles into certain body points (acupuncture) to improve how you feel.  Do not smoke.  Use a humidifier to keep the air in your house free of odors.  Get lots of fresh air. Contact a doctor if:  You need medicine to feel better.  You feel dizzy or lightheaded.  You are losing weight. Get help right away if:  You feel very sick to your stomach and cannot stop throwing up.  You pass out (faint). This information is not intended to replace advice given to you by your health care provider. Make sure you discuss any questions you have with your health care provider. Document Released: 04/10/2004 Document Revised: 08/09/2015 Document Reviewed: 08/18/2012 Elsevier Interactive Patient Education  2017 Elsevier Inc.   Folic Acid and Pregnancy What is folic acid? Folic acid is a B vitamin. Your body needs it to make new cells. Folic acid is also called folate. Folate is the form of the B vitamin that is found naturally in food. Folic acid  is the artificial (synthetic) form of the B vitamin. Folic acid is added to certain foods (fortified foods) and is also available in dietary supplements such as prenatal vitamins. All women who may become pregnant or are planning to become pregnant need at least 400-800 mcg of folic acid daily. Most pregnant women need 600-800 mcg of folic acid per day, but some women need more. What are the benefits of taking folic acid during  pregnancy? Taking folic acid during pregnancy helps to prevent abnormalities that can develop in an unborn baby's brain, spine, or spinal column (neural tube defects). These defects include:  Spina bifida. This is when the spinal column does not close completely during development, leaving the spinal cord exposed. This means the nerves that control leg movements and other bodily functions do not work. Spina bifida causes lifelong disabilities.  Anencephaly. Babies born with anencephaly have an underdeveloped brain. They may have little or no brain matter, and they could also be missing parts of the skull.  Neural tube defects occur in the first few months (first trimester) of pregnancy. If you are trying to get pregnant, make sure you get enough folic acid for at least one month before you start trying. It is important to get enough folic acid even if you are not trying to get pregnant, because some pregnancies are unplanned.If your pregnancy is unplanned, start taking folic acid as soon as you find out that you are pregnant. Folic acid can also help to prevent a drop in red blood cells that carry oxygen throughout the body (anemia). Anemia during pregnancy is associated with complications such as low birth weight and premature birth. What are the side effects of taking folic acid? Folic acid supplements may cause side effects, such as:  Diarrhea.  Abdominal cramping.  Folic acid can also interact with certain medicines that are used to treat other conditions. These medicines include:  Methotrexate. This is an anticancer drug that is also used to treat some autoimmune diseases.  Antiepileptic medicine, such as phenytoin, carbamazepine, and valproate.  Sulfasalazine. This medicine is used to treat ulcerative colitis.  How should I take folic acid during pregnancy? Even women who have a healthy, well-balanced diet may not get enough folate from food. Synthetic folic acid is easier for your  body to use than the folate that is found naturally in certain foods. In addition to eating folate-rich foods, you can ensure that you get enough folic acid by taking prenatal vitamins and eating foods that are fortified with folic acid. Make sure your prenatal vitamin or B vitamin supplement contains 400-800 micrograms of folic acid. What foods should I eat? Folate is found naturally in:  Dark green leafy vegetables, such as spinach.  Asparagus.  Brussels sprouts.  Citrus fruits and juices.  Nuts.  Beans.  Peas.  Eggs.  Meat, poultry, and seafood.  Soy products.  Whole grains.  Folic acid is often added to certain foods, including:  Bread.  Pasta.  White rice.  Breakfast cereal.  Flour.  Cornmeal.  When should I seek medical care? Some women may need to take more than the recommended amount of folic acid. Talk with your health care provider about your folic acid needs if:  You had a baby with a neural tube defect and you want to get pregnant again.  You have a family history of spina bifida.  You have spina bifida and you want to get pregnant.  Talk with your doctor about folic acid supplements if  you are taking medicine for any of the following conditions:  Epilepsy.  Type 2 diabetes.  Autoimmune diseases, including rheumatoid arthritis, lupus, psoriasis, celiac disease, and inflammatory bowel disease.  Asthma.  Kidney disease.  Liver disease.  Sickle cell disease.  This information is not intended to replace advice given to you by your health care provider. Make sure you discuss any questions you have with your health care provider. Document Released: 03/06/2003 Document Revised: 01/28/2016 Document Reviewed: 11/15/2014 Elsevier Interactive Patient Education  2017 [employer]    IF you received an x-ray today, you will receive an invoice from [facility]. Please contact [facility] at [phone] with questions or  concerns regarding your invoice.   IF you received labwork today, you will receive an invoice from [facility]. Please contact [facility] at [phone] with questions or concerns regarding your invoice.   Our billing staff will not be able to assist you with questions regarding bills from these companies.  You will be contacted with the lab results as soon as they are available. The fastest way to get your results is to activate your My Chart account. Instructions are located on the last page of this paperwork. If you have not heard from us regarding the results in 2 weeks, please contact this office.

## 2017-03-18 NOTE — Progress Notes (Signed)
Carrie Boyd  MRN: [id number] DOB: 29-Jul-1995  Subjective:  Carrie Boyd is a 22 y.o. female seen in office today for a chief complaint of concern for pregnancy. LMP 02/04/2017. Took pregnancy test yesterday and it was positive.  She is also been having intermittent nausea and vomiting for the past few weeks.  It has gotten significantly worse yesterday and today. Has decreased appetite and some dizziness.  Has not been able to eat anything since yesterday.  She has been able to drink fluids but still vomits throughout the day.  Also has pain with urinating.  Notes it is not dysuria it just feels uncomfortable.  Denies hematuria, vaginal discharge, urinary frequency, and urinary urgency. Has not tried anyhing for relief.   Review of Systems  Constitutional: Positive for fever (subjective fever). Negative for chills and diaphoresis.  HENT: Positive for congestion.   Respiratory: Positive for cough (started yesterday, non productive).   Cardiovascular: Negative for chest pain and palpitations.  Gastrointestinal: Negative for abdominal distention, constipation and diarrhea.  Genitourinary: Negative for pelvic pain, vaginal bleeding, vaginal discharge and vaginal pain.  Musculoskeletal: Negative for myalgias.  Neurological: Negative for weakness.    There are no active problems to display for this patient.   Current Outpatient Medications on File Prior to Visit  Medication Sig Dispense Refill  . cyclobenzaprine (FLEXERIL) 10 MG tablet Take 1 tablet (10 mg total) by mouth 3 (three) times daily as needed for muscle spasms. 30 tablet 0  . naproxen (NAPROSYN) 500 MG tablet Take 1 tablet (500 mg total) by mouth 2 (two) times daily with a meal. 30 tablet 0  . Selenium Sulfide 2.25 % SHAM Apply 1 application topically daily. Apply shampoo to affected area and lather with small amounts of water; leave on skin for 10 minutes, then rinse thoroughly; repeat once every day for 7 days. 180 mL 0    No current facility-administered medications on file prior to visit.     No Known Allergies    Social History   Socioeconomic History  . Marital status: Single    Spouse name: Not on file  . Number of children: Not on file  . Years of education: Not on file  . Highest education level: Not on file  Social Needs  . Financial resource strain: Not on file  . Food insecurity - worry: Not on file  . Food insecurity - inability: Not on file  . Transportation needs - medical: Not on file  . Transportation needs - non-medical: Not on file  Occupational History  . Not on file  Tobacco Use  . Smoking status: Never Smoker  . Smokeless tobacco: Never Used  Substance and Sexual Activity  . Alcohol use: No  . Drug use: No  . Sexual activity: Yes  Other Topics Concern  . Not on file  Social History Narrative  . Not on file    Objective:  BP 118/62 (BP Location: Left Arm, Patient Position: Sitting, Cuff Size: Normal)   Pulse (!) 108   Temp 98.1 F (36.7 C) (Oral)   Resp 18   Ht 5' 3.39" (1.61 m)   Wt 111 lb (50.3 kg)   LMP 02/04/2017 (Approximate)   SpO2 99%   BMI 19.42 kg/m   Physical Exam  Constitutional: She is oriented to person, place, and time.  Well-developed, well-nourished, appears uncomfortable lying on exam table.  HENT:  Head: Normocephalic and atraumatic.  Nose: Mucosal edema (mild) present.  Mouth/Throat: Uvula is midline.  Mucous membranes are dry (mildly dry). No posterior oropharyngeal erythema.  Eyes: Conjunctivae are normal.  Neck: Normal range of motion.  Cardiovascular: Regular rhythm, normal heart sounds and intact distal pulses. Tachycardia present.  Pulmonary/Chest: Effort normal and breath sounds normal. She has no wheezes. She has no rhonchi. She has no rales.  Abdominal: Soft. Normal appearance and bowel sounds are normal. There is no tenderness.  Lymphadenopathy:       Head (right side): No submental, no submandibular, no tonsillar, no  preauricular, no posterior auricular and no occipital adenopathy present.       Head (left side): No submental, no submandibular, no tonsillar, no preauricular, no posterior auricular and no occipital adenopathy present.    She has no cervical adenopathy.       Right: No supraclavicular adenopathy present.       Left: No supraclavicular adenopathy present.  Neurological: She is alert and oriented to person, place, and time. Gait normal.  Skin: Skin is warm and dry.  Psychiatric: Affect normal.  Vitals reviewed.    Results for orders placed or performed in visit on 03/18/17 (from the past 24 hour(s))  POCT urine pregnancy     Status: Abnormal   Collection Time: 03/18/17 12:19 PM  Result Value Ref Range   Preg Test, Ur Positive (A) Negative  POCT urinalysis dipstick     Status: Abnormal   Collection Time: 03/18/17 12:40 PM  Result Value Ref Range   Color, UA yellow yellow   Clarity, UA clear clear   Glucose, UA negative negative mg/dL   Bilirubin, UA moderate (A) negative   Ketones, POC UA >= (160) (A) negative mg/dL   Spec Grav, UA >=1.030 (A) 1.010 - 1.025   Blood, UA negative negative   pH, UA 6.0 5.0 - 8.0   Protein Ur, POC =100 (A) negative mg/dL   Urobilinogen, UA 0.2 0.2 or 1.0 E.U./dL   Nitrite, UA Negative Negative   Leukocytes, UA Negative Negative  POCT CBC     Status: Abnormal   Collection Time: 03/18/17  1:05 PM  Result Value Ref Range   WBC 9.3 4.6 - 10.2 K/uL   Lymph, poc 1.5 0.6 - 3.4   POC LYMPH PERCENT 15.8 10 - 50 %L   MID (cbc) 0.3 0 - 0.9   POC MID % 3.2 0 - 12 %M   POC Granulocyte 7.5 (A) 2 - 6.9   Granulocyte percent 81.0 (A) 37 - 80 %G   RBC 5.60 (A) 4.04 - 5.48 M/uL   Hemoglobin 13.3 12.2 - 16.2 g/dL   HCT, POC 42.1 37.7 - 47.9 %   MCV 75.2 (A) 80 - 97 fL   MCH, POC 23.7 (A) 27 - 31.2 pg   MCHC 31.6 (A) 31.8 - 35.4 g/dL   RDW, POC 15 %   Platelet Count, POC 228 142 - 424 K/uL   MPV 9.0 0 - 99.8 fL   Wt Readings from Last 3 Encounters:    03/18/17 111 lb (50.3 kg)  03/02/17 114 lb 6.4 oz (51.9 kg)  02/03/17 117 lb 3.2 oz (53.2 kg)   Orthostatic VS for the past 24 hrs:  BP- Lying Pulse- Lying BP- Sitting Pulse- Sitting BP- Standing at 0 minutes Pulse- Standing at 0 minutes  03/18/17 1426 106/70 93 105/72 102 112/76 93   Post administration of 2 L of IV fluids she reports that she feels better.  She appears less dehydrated.  Tachycardia has improved from 108 bpm to  99 bpm.  She passed oral challenge test with 8 ounce cup of water.   Assessment and Plan :  1. Missed period - POCT urine pregnancy 2. Dysuria UA consistent with dehydration.  No leukocytes or nitrites noted. - POCT CBC - POCT urinalysis dipstick 3. Cough Lungs are CTAB.  Point-of-care flu negative. - POC Influenza A&B(BINAX/QUICKVUE) 4. Nausea and vomiting, intractability of vomiting not specified, unspecified vomiting type Likely due to pregnancy.  Improved with fluids in office.  Patient given Rx for vitamin B6 and doxylamine.  Patient advised that if her vomiting persists despite medication and she cannot tolerate oral liquids or food at home, she needs to seek care immediately at the ED. - CMP14+EGFR - sodium chloride 0.9 % bolus 1,000 mL - sodium chloride 0.9 % bolus 1,000 mL - Orthostatic vital signs - vitamin B-6 (PYRIDOXINE) 25 MG tablet; Take 1 tablet (25 mg total) by mouth daily.  Dispense: 30 tablet; Refill: 0 - doxylamine, Sleep, (UNISOM) 25 MG tablet; Take 0.5-1 tablets (12.5-25 mg total) by mouth at bedtime as needed (vomiting).  Dispense: 30 tablet; Refill: 0 5. Positive pregnancy test This is patient's first pregnancy.  Given referral for OB/GYN.  Patient notes she might be interested in care from health department.  Given her information on how to do so. - Ambulatory referral to Obstetrics / Gynecology  6. Tinea versicolor As patient was leaving, she informed me that she lost the Rx for selenium sulfide shampoo that she was given at her  last visit with me.  Would like another prescription. Educated that there has been inadequate human data available studying the use of this medication during pregnancy, though risk of fetal harm not expected based on minimal systemic absorption.  I provided her with another prescription.  However, after further thought and investigation, I contacted patient via phone and encouraged her not to use the shampoo but wait until her initial appointment with the OB to discuss other options for treatment of tinea versicolor.  Patient notes she has not used the medication yet and will not use it unless the OB says it is okay.  - Selenium Sulfide 2.25 % SHAM; Apply 1 application topically daily. Apply shampoo to affected area and lather with small amounts of water; leave on skin for 10 minutes, then rinse thoroughly; repeat once every day for 7 days.  Dispense: 180 mL; Refill: 0  Tenna Delaine PA-C  [facility] at [facility] 03/18/2017 1:19 PM

## 2017-03-20 ENCOUNTER — Ambulatory Visit: Payer: BLUE CROSS/BLUE SHIELD | Admitting: Physician Assistant

## 2017-03-20 ENCOUNTER — Other Ambulatory Visit: Payer: Self-pay | Admitting: *Deleted

## 2017-03-20 ENCOUNTER — Encounter: Payer: Self-pay | Admitting: Physician Assistant

## 2017-03-20 ENCOUNTER — Telehealth: Payer: Self-pay | Admitting: Physician Assistant

## 2017-03-20 DIAGNOSIS — E878 Other disorders of electrolyte and fluid balance, not elsewhere classified: Secondary | ICD-10-CM

## 2017-03-20 LAB — CMP14+EGFR
ALK PHOS: 58 IU/L (ref 39–117)
ALT: 18 IU/L (ref 0–32)
AST: 17 IU/L (ref 0–40)
Albumin/Globulin Ratio: 1.8 (ref 1.2–2.2)
Albumin: 4.6 g/dL (ref 3.5–5.5)
BUN/Creatinine Ratio: 26 — ABNORMAL HIGH (ref 9–23)
BUN: 16 mg/dL (ref 6–20)
Bilirubin Total: 0.5 mg/dL (ref 0.0–1.2)
CALCIUM: 9 mg/dL (ref 8.7–10.2)
CO2: 15 mmol/L — AB (ref 20–29)
CREATININE: 0.62 mg/dL (ref 0.57–1.00)
Chloride: 104 mmol/L (ref 96–106)
GFR calc Af Amer: 149 mL/min/{1.73_m2} (ref >59)
GFR calc non Af Amer: 129 mL/min/{1.73_m2} (ref >59)
GLUCOSE: 69 mg/dL (ref 65–99)
Globulin, Total: 2.5 g/dL (ref 1.5–4.5)
Potassium: 3.7 mmol/L (ref 3.5–5.2)
Sodium: 137 mmol/L (ref 134–144)
Total Protein: 7.1 g/dL (ref 6.0–8.5)

## 2017-03-20 NOTE — Telephone Encounter (Signed)
Contacted by Sue LushAndrea at [facility]; she called to report critical value; pt has total carbon dioxide is 15 (from CMP14 dated 03/18/17); will noitfy [place] of this value; pt was seen by [place] Wiseman;  Value given to Jonny RuizJohn who will forward information to provider

## 2017-03-23 LAB — COMPREHENSIVE METABOLIC PANEL
ALK PHOS: 57 IU/L (ref 39–117)
ALT: 18 IU/L (ref 0–32)
AST: 16 IU/L (ref 0–40)
Albumin/Globulin Ratio: 1.8 (ref 1.2–2.2)
Albumin: 4.7 g/dL (ref 3.5–5.5)
BUN/Creatinine Ratio: 14 (ref 9–23)
BUN: 8 mg/dL (ref 6–20)
Bilirubin Total: 0.2 mg/dL (ref 0.0–1.2)
CO2: 20 mmol/L (ref 20–29)
Calcium: 9.4 mg/dL (ref 8.7–10.2)
Chloride: 102 mmol/L (ref 96–106)
Creatinine, Ser: 0.56 mg/dL — ABNORMAL LOW (ref 0.57–1.00)
GFR calc Af Amer: 154 mL/min/{1.73_m2} (ref >59)
GFR calc non Af Amer: 134 mL/min/{1.73_m2} (ref >59)
GLOBULIN, TOTAL: 2.6 g/dL (ref 1.5–4.5)
GLUCOSE: 80 mg/dL (ref 65–99)
Potassium: 4.2 mmol/L (ref 3.5–5.2)
SODIUM: 137 mmol/L (ref 134–144)
Total Protein: 7.3 g/dL (ref 6.0–8.5)

## 2017-03-23 LAB — LACTIC ACID, PLASMA: LACTATE: 13 mg/dL (ref 4.8–25.7)

## 2017-03-26 ENCOUNTER — Encounter: Payer: Self-pay | Admitting: *Deleted

## 2017-03-26 DIAGNOSIS — N912 Amenorrhea, unspecified: Secondary | ICD-10-CM | POA: Diagnosis not present

## 2017-03-26 DIAGNOSIS — R112 Nausea with vomiting, unspecified: Secondary | ICD-10-CM | POA: Diagnosis not present

## 2017-03-26 DIAGNOSIS — Z3201 Encounter for pregnancy test, result positive: Secondary | ICD-10-CM | POA: Diagnosis not present

## 2017-04-15 DIAGNOSIS — Z3401 Encounter for supervision of normal first pregnancy, first trimester: Secondary | ICD-10-CM | POA: Diagnosis not present

## 2017-04-15 DIAGNOSIS — Z3A09 9 weeks gestation of pregnancy: Secondary | ICD-10-CM | POA: Diagnosis not present

## 2017-04-15 DIAGNOSIS — Z124 Encounter for screening for malignant neoplasm of cervix: Secondary | ICD-10-CM | POA: Diagnosis not present

## 2017-04-15 DIAGNOSIS — Z1151 Encounter for screening for human papillomavirus (HPV): Secondary | ICD-10-CM | POA: Diagnosis not present

## 2017-04-15 DIAGNOSIS — O218 Other vomiting complicating pregnancy: Secondary | ICD-10-CM | POA: Diagnosis not present

## 2017-04-15 DIAGNOSIS — Z113 Encounter for screening for infections with a predominantly sexual mode of transmission: Secondary | ICD-10-CM | POA: Diagnosis not present

## 2017-04-15 DIAGNOSIS — O26891 Other specified pregnancy related conditions, first trimester: Secondary | ICD-10-CM | POA: Diagnosis not present

## 2017-04-15 DIAGNOSIS — Z3689 Encounter for other specified antenatal screening: Secondary | ICD-10-CM | POA: Diagnosis not present

## 2017-04-16 DIAGNOSIS — Z124 Encounter for screening for malignant neoplasm of cervix: Secondary | ICD-10-CM | POA: Diagnosis not present

## 2017-04-16 DIAGNOSIS — Z1151 Encounter for screening for human papillomavirus (HPV): Secondary | ICD-10-CM | POA: Diagnosis not present

## 2017-06-18 DIAGNOSIS — Z3A18 18 weeks gestation of pregnancy: Secondary | ICD-10-CM | POA: Diagnosis not present

## 2017-06-18 DIAGNOSIS — Z363 Encounter for antenatal screening for malformations: Secondary | ICD-10-CM | POA: Diagnosis not present

## 2017-07-07 ENCOUNTER — Ambulatory Visit: Payer: BLUE CROSS/BLUE SHIELD | Admitting: Physician Assistant

## 2017-08-20 DIAGNOSIS — Z23 Encounter for immunization: Secondary | ICD-10-CM | POA: Diagnosis not present

## 2017-08-20 DIAGNOSIS — Z3689 Encounter for other specified antenatal screening: Secondary | ICD-10-CM | POA: Diagnosis not present

## 2017-09-01 ENCOUNTER — Inpatient Hospital Stay (HOSPITAL_COMMUNITY)
Admission: AD | Admit: 2017-09-01 | Discharge: 2017-09-01 | Disposition: A | Discharge status: Home / Self Care | Payer: BLUE CROSS/BLUE SHIELD | Source: Ambulatory Visit | Attending: Obstetrics and Gynecology | Admitting: Obstetrics and Gynecology

## 2017-09-01 ENCOUNTER — Encounter (HOSPITAL_COMMUNITY): Payer: Self-pay

## 2017-09-01 DIAGNOSIS — Z3689 Encounter for other specified antenatal screening: Secondary | ICD-10-CM

## 2017-09-01 DIAGNOSIS — Z3A29 29 weeks gestation of pregnancy: Secondary | ICD-10-CM | POA: Insufficient documentation

## 2017-09-01 DIAGNOSIS — Z79899 Other long term (current) drug therapy: Secondary | ICD-10-CM | POA: Insufficient documentation

## 2017-09-01 DIAGNOSIS — O368130 Decreased fetal movements, third trimester, not applicable or unspecified: Secondary | ICD-10-CM | POA: Diagnosis not present

## 2017-09-01 HISTORY — DX: Other specified health status: Z78.9

## 2017-09-01 NOTE — Discharge Instructions (Signed)

## 2017-09-01 NOTE — Progress Notes (Signed)
Pt seen in MAU for no FM x 2 days FHR category 1 tracing baseline 150

## 2017-09-01 NOTE — MAU Note (Signed)
Pt reports not feeling any movement since Sunday night. Pt denies LOF, vaginal bleeding, or pain.

## 2017-09-01 NOTE — MAU Provider Note (Addendum)
Patient Carrie Boyd is a 22 y.o. G1P0 At [redacted]w[redacted]d  Here with complaints of decreased fetal movements. She denies bleeding, LOF, contractions, discharge or other ob-gyn complaints.  History     CSN: [id number]  Arrival date and time: 09/01/17 1846   First Provider Initiated Contact with Patient 09/01/17 1950      Chief Complaint  Patient presents with  . Decreased Fetal Movement   HPI Patient states that she has not felt the baby move in "two days"; when pressed, she states that the baby did move last night but not as much. She called her Dr.'s office and was told to drink some juice, which she did. She did not lie down and count fetal movements but instead came in to be seen.  OB History    Gravida  1   Para      Term      Preterm      AB      Living        SAB      TAB      Ectopic      Multiple      Live Births              Past Medical History:  Diagnosis Date  . Medical history non-contributory     Past Surgical History:  Procedure Laterality Date  . NO PAST SURGERIES      No family history on file.  Social History   Tobacco Use  . Smoking status: Never Smoker  . Smokeless tobacco: Never Used  Substance Use Topics  . Alcohol use: No  . Drug use: No    Allergies: No Known Allergies  Medications Prior to Admission  Medication Sig Dispense Refill Last Dose  . Prenatal Vit-Fe Fumarate-FA (MULTIVITAMIN-PRENATAL) 27-0.8 MG TABS tablet Take 1 tablet by mouth daily at 12 noon.   08/31/2017 at Unknown time  . cyclobenzaprine (FLEXERIL) 10 MG tablet Take 1 tablet (10 mg total) by mouth 3 (three) times daily as needed for muscle spasms. 30 tablet 0 not taking  . doxylamine, Sleep, (UNISOM) 25 MG tablet Take 0.5-1 tablets (12.5-25 mg total) by mouth at bedtime as needed (vomiting). 30 tablet 0 not taking  . naproxen (NAPROSYN) 500 MG tablet Take 1 tablet (500 mg total) by mouth 2 (two) times daily with a meal. 30 tablet 0 not taking  . vitamin B-6  (PYRIDOXINE) 25 MG tablet Take 1 tablet (25 mg total) by mouth daily. 30 tablet 0 not taking    Review of Systems  Constitutional: Negative.   HENT: Negative.   Respiratory: Negative.   Cardiovascular: Negative.   Gastrointestinal: Negative.   Genitourinary: Negative.   Musculoskeletal: Negative.   Neurological: Negative.   Psychiatric/Behavioral: Negative.    Physical Exam   Blood pressure 115/79, pulse 84, temperature 98.2 F (36.8 C), resp. rate 16, last menstrual period 02/04/2017, SpO2 100 %.  Physical Exam  Constitutional: She is oriented to person, place, and time. She appears well-developed.  HENT:  Head: Normocephalic.  Neck: Normal range of motion.  Respiratory: Effort normal.  GI: Soft.  Musculoskeletal: Normal range of motion.  Neurological: She is alert and oriented to person, place, and time.  Skin: Skin is warm and dry.  Psychiatric: She has a normal mood and affect.    MAU Course  Procedures  MDM NST: 150 bpm, mod var, present acel, neg decels. No contractions.  Patient felt fetal movements while in MAU. Patient relieved to  feel fetal movements; now expressing desire to be discharged.   Assessment and Plan   1. NST (non-stress test) reactive    2. Discussed patient's tracing with Dr. Senaida Oresichardson, who agrees patient is stable for discharge. Patient will keep appt tomorrow with Dr. Jackelyn KnifeMeisinger.   3. Patient stable for discharge; reviewed warning signs and when to return to MAU.   4. Reviewed fetal kick counts and when to come to MAU; patient verbalized understanding.    Charlesetta GaribaldiKathryn Lorraine Kooistra 09/01/2017, 8:23 PM

## 2017-09-18 DIAGNOSIS — O4443 Low lying placenta NOS or without hemorrhage, third trimester: Secondary | ICD-10-CM | POA: Diagnosis not present

## 2017-09-18 DIAGNOSIS — Z3A31 31 weeks gestation of pregnancy: Secondary | ICD-10-CM | POA: Diagnosis not present

## 2017-10-15 DIAGNOSIS — Z113 Encounter for screening for infections with a predominantly sexual mode of transmission: Secondary | ICD-10-CM | POA: Diagnosis not present

## 2017-10-30 ENCOUNTER — Inpatient Hospital Stay (HOSPITAL_COMMUNITY): Payer: BLUE CROSS/BLUE SHIELD | Admitting: Anesthesiology

## 2017-10-30 ENCOUNTER — Encounter (HOSPITAL_COMMUNITY): Payer: Self-pay

## 2017-10-30 ENCOUNTER — Inpatient Hospital Stay (HOSPITAL_COMMUNITY)
Admission: AD | Admit: 2017-10-30 | Discharge: 2017-11-01 | DRG: 807 | Disposition: A | Discharge status: Home / Self Care | Payer: BLUE CROSS/BLUE SHIELD | Attending: Obstetrics and Gynecology | Admitting: Obstetrics and Gynecology

## 2017-10-30 DIAGNOSIS — Z2882 Immunization not carried out because of caregiver refusal: Secondary | ICD-10-CM | POA: Diagnosis not present

## 2017-10-30 DIAGNOSIS — O99824 Streptococcus B carrier state complicating childbirth: Secondary | ICD-10-CM | POA: Diagnosis not present

## 2017-10-30 DIAGNOSIS — Z3A37 37 weeks gestation of pregnancy: Secondary | ICD-10-CM

## 2017-10-30 DIAGNOSIS — Z831 Family history of other infectious and parasitic diseases: Secondary | ICD-10-CM | POA: Diagnosis not present

## 2017-10-30 DIAGNOSIS — O26893 Other specified pregnancy related conditions, third trimester: Secondary | ICD-10-CM | POA: Diagnosis present

## 2017-10-30 DIAGNOSIS — Z3483 Encounter for supervision of other normal pregnancy, third trimester: Secondary | ICD-10-CM | POA: Diagnosis not present

## 2017-10-30 DIAGNOSIS — Z058 Observation and evaluation of newborn for other specified suspected condition ruled out: Secondary | ICD-10-CM | POA: Diagnosis not present

## 2017-10-30 LAB — RPR: RPR: NONREACTIVE

## 2017-10-30 LAB — CBC
HCT: 37.2 % (ref 36.0–46.0)
HEMOGLOBIN: 12.8 g/dL (ref 12.0–15.0)
MCH: 24.6 pg — ABNORMAL LOW (ref 26.0–34.0)
MCHC: 34.4 g/dL (ref 30.0–36.0)
MCV: 71.5 fL — AB (ref 78.0–100.0)
PLATELETS: 189 10*3/uL (ref 150–400)
RBC: 5.2 MIL/uL — AB (ref 3.87–5.11)
RDW: 15.2 % (ref 11.5–15.5)
WBC: 14.5 10*3/uL — AB (ref 4.0–10.5)

## 2017-10-30 LAB — TYPE AND SCREEN
ABO/RH(D): O POS
Antibody Screen: NEGATIVE

## 2017-10-30 LAB — ABO/RH: ABO/RH(D): O POS

## 2017-10-30 MED ORDER — OXYTOCIN 40 UNITS IN LACTATED RINGERS INFUSION - SIMPLE MED
1.0000 m[IU]/min | INTRAVENOUS | Status: DC
  Administered 2017-10-30: 1 m[IU]/min via INTRAVENOUS

## 2017-10-30 MED ORDER — ONDANSETRON HCL 4 MG/2ML IJ SOLN: 4.0000 mg | INTRAMUSCULAR | Status: DC | PRN

## 2017-10-30 MED ORDER — OXYCODONE-ACETAMINOPHEN 5-325 MG PO TABS: 1.0000 | ORAL_TABLET | ORAL | Status: DC | PRN

## 2017-10-30 MED ORDER — OXYTOCIN 40 UNITS IN LACTATED RINGERS INFUSION - SIMPLE MED
2.5000 [IU]/h | INTRAVENOUS | Status: DC
  Filled 2017-10-30: qty 1000

## 2017-10-30 MED ORDER — LACTATED RINGERS IV SOLN: 500.0000 mL | Freq: Once | INTRAVENOUS | Status: DC

## 2017-10-30 MED ORDER — LIDOCAINE HCL (PF) 1 % IJ SOLN
INTRAMUSCULAR | Status: DC | PRN
  Administered 2017-10-30 (×2): 4 mL via EPIDURAL

## 2017-10-30 MED ORDER — PRENATAL MULTIVITAMIN CH
1.0000 | ORAL_TABLET | Freq: Every day | ORAL | Status: DC
  Administered 2017-10-31 – 2017-11-01 (×2): 1 via ORAL
  Filled 2017-10-30 (×2): qty 1

## 2017-10-30 MED ORDER — PHENYLEPHRINE 40 MCG/ML (10ML) SYRINGE FOR IV PUSH (FOR BLOOD PRESSURE SUPPORT)
80.0000 ug | PREFILLED_SYRINGE | INTRAVENOUS | Status: DC | PRN
  Filled 2017-10-30: qty 10
  Filled 2017-10-30: qty 5

## 2017-10-30 MED ORDER — BUTORPHANOL TARTRATE 1 MG/ML IJ SOLN
1.0000 mg | INTRAMUSCULAR | Status: DC | PRN
  Administered 2017-10-30 (×2): 1 mg via INTRAVENOUS
  Filled 2017-10-30 (×3): qty 1

## 2017-10-30 MED ORDER — DIBUCAINE 1 % RE OINT: 1.0000 "application " | TOPICAL_OINTMENT | RECTAL | Status: DC | PRN

## 2017-10-30 MED ORDER — SOD CITRATE-CITRIC ACID 500-334 MG/5ML PO SOLN: 30.0000 mL | ORAL | Status: DC | PRN

## 2017-10-30 MED ORDER — ACETAMINOPHEN 325 MG PO TABS
650.0000 mg | ORAL_TABLET | ORAL | Status: DC | PRN
  Administered 2017-10-31 – 2017-11-01 (×2): 650 mg via ORAL
  Filled 2017-10-30 (×2): qty 2

## 2017-10-30 MED ORDER — LACTATED RINGERS IV SOLN
INTRAVENOUS | Status: DC
  Administered 2017-10-30 (×2): via INTRAVENOUS

## 2017-10-30 MED ORDER — TETANUS-DIPHTH-ACELL PERTUSSIS 5-2.5-18.5 LF-MCG/0.5 IM SUSP: 0.5000 mL | Freq: Once | INTRAMUSCULAR | Status: DC

## 2017-10-30 MED ORDER — PHENYLEPHRINE 40 MCG/ML (10ML) SYRINGE FOR IV PUSH (FOR BLOOD PRESSURE SUPPORT)
80.0000 ug | PREFILLED_SYRINGE | INTRAVENOUS | Status: DC | PRN
  Filled 2017-10-30: qty 5

## 2017-10-30 MED ORDER — METHYLERGONOVINE MALEATE 0.2 MG PO TABS: 0.2000 mg | ORAL_TABLET | ORAL | Status: DC | PRN

## 2017-10-30 MED ORDER — DIPHENHYDRAMINE HCL 50 MG/ML IJ SOLN: 12.5000 mg | INTRAMUSCULAR | Status: DC | PRN

## 2017-10-30 MED ORDER — ZOLPIDEM TARTRATE 5 MG PO TABS: 5.0000 mg | ORAL_TABLET | Freq: Every evening | ORAL | Status: DC | PRN

## 2017-10-30 MED ORDER — IBUPROFEN 600 MG PO TABS
600.0000 mg | ORAL_TABLET | Freq: Four times a day (QID) | ORAL | Status: DC
  Administered 2017-10-30 – 2017-11-01 (×7): 600 mg via ORAL
  Filled 2017-10-30 (×7): qty 1

## 2017-10-30 MED ORDER — PENICILLIN G 3 MILLION UNITS IVPB - SIMPLE MED
3.0000 10*6.[IU] | INTRAVENOUS | Status: DC
  Administered 2017-10-30 (×2): 3 10*6.[IU] via INTRAVENOUS
  Filled 2017-10-30 (×2): qty 100
  Filled 2017-10-30: qty 3
  Filled 2017-10-30: qty 100
  Filled 2017-10-30: qty 3

## 2017-10-30 MED ORDER — ONDANSETRON HCL 4 MG/2ML IJ SOLN: 4.0000 mg | Freq: Four times a day (QID) | INTRAMUSCULAR | Status: DC | PRN

## 2017-10-30 MED ORDER — OXYCODONE HCL 5 MG PO TABS
5.0000 mg | ORAL_TABLET | ORAL | Status: DC | PRN
  Administered 2017-10-31: 5 mg via ORAL
  Filled 2017-10-30: qty 1

## 2017-10-30 MED ORDER — ACETAMINOPHEN 325 MG PO TABS: 650.0000 mg | ORAL_TABLET | ORAL | Status: DC | PRN

## 2017-10-30 MED ORDER — MEASLES, MUMPS & RUBELLA VAC ~~LOC~~ INJ
0.5000 mL | INJECTION | Freq: Once | SUBCUTANEOUS | Status: DC
  Filled 2017-10-30: qty 0.5

## 2017-10-30 MED ORDER — MAGNESIUM HYDROXIDE 400 MG/5ML PO SUSP: 30.0000 mL | ORAL | Status: DC | PRN

## 2017-10-30 MED ORDER — LACTATED RINGERS IV SOLN
500.0000 mL | INTRAVENOUS | Status: DC | PRN
  Administered 2017-10-30: 500 mL via INTRAVENOUS

## 2017-10-30 MED ORDER — OXYCODONE-ACETAMINOPHEN 5-325 MG PO TABS
2.0000 | ORAL_TABLET | ORAL | Status: DC | PRN
  Administered 2017-10-30: 2 via ORAL
  Filled 2017-10-30: qty 2

## 2017-10-30 MED ORDER — FENTANYL 2.5 MCG/ML BUPIVACAINE 1/10 % EPIDURAL INFUSION (WH - ANES)
14.0000 mL/h | INTRAMUSCULAR | Status: DC | PRN
  Administered 2017-10-30 (×2): 14 mL/h via EPIDURAL
  Filled 2017-10-30 (×2): qty 100

## 2017-10-30 MED ORDER — SENNOSIDES-DOCUSATE SODIUM 8.6-50 MG PO TABS
2.0000 | ORAL_TABLET | ORAL | Status: DC
  Administered 2017-10-30 – 2017-10-31 (×2): 2 via ORAL
  Filled 2017-10-30 (×2): qty 2

## 2017-10-30 MED ORDER — EPHEDRINE 5 MG/ML INJ
10.0000 mg | INTRAVENOUS | Status: DC | PRN
  Filled 2017-10-30: qty 2

## 2017-10-30 MED ORDER — SIMETHICONE 80 MG PO CHEW: 80.0000 mg | CHEWABLE_TABLET | ORAL | Status: DC | PRN

## 2017-10-30 MED ORDER — WITCH HAZEL-GLYCERIN EX PADS: 1.0000 "application " | MEDICATED_PAD | CUTANEOUS | Status: DC | PRN

## 2017-10-30 MED ORDER — OXYTOCIN BOLUS FROM INFUSION
500.0000 mL | Freq: Once | INTRAVENOUS | Status: AC
  Administered 2017-10-30: 500 mL via INTRAVENOUS

## 2017-10-30 MED ORDER — BENZOCAINE-MENTHOL 20-0.5 % EX AERO
1.0000 "application " | INHALATION_SPRAY | CUTANEOUS | Status: DC | PRN
  Administered 2017-10-31: 1 via TOPICAL
  Filled 2017-10-30: qty 56

## 2017-10-30 MED ORDER — LIDOCAINE HCL (PF) 1 % IJ SOLN
30.0000 mL | INTRAMUSCULAR | Status: DC | PRN
  Filled 2017-10-30: qty 30

## 2017-10-30 MED ORDER — ONDANSETRON HCL 4 MG PO TABS: 4.0000 mg | ORAL_TABLET | ORAL | Status: DC | PRN

## 2017-10-30 MED ORDER — TERBUTALINE SULFATE 1 MG/ML IJ SOLN
0.2500 mg | Freq: Once | INTRAMUSCULAR | Status: DC | PRN
  Filled 2017-10-30: qty 1

## 2017-10-30 MED ORDER — OXYCODONE HCL 5 MG PO TABS: 10.0000 mg | ORAL_TABLET | ORAL | Status: DC | PRN

## 2017-10-30 MED ORDER — COCONUT OIL OIL: 1.0000 "application " | TOPICAL_OIL | Status: DC | PRN

## 2017-10-30 MED ORDER — SODIUM CHLORIDE 0.9 % IV SOLN
5.0000 10*6.[IU] | Freq: Once | INTRAVENOUS | Status: AC
  Administered 2017-10-30: 5 10*6.[IU] via INTRAVENOUS
  Filled 2017-10-30: qty 5

## 2017-10-30 MED ORDER — METHYLERGONOVINE MALEATE 0.2 MG/ML IJ SOLN: 0.2000 mg | INTRAMUSCULAR | Status: DC | PRN

## 2017-10-30 MED ORDER — DIPHENHYDRAMINE HCL 25 MG PO CAPS: 25.0000 mg | ORAL_CAPSULE | Freq: Four times a day (QID) | ORAL | Status: DC | PRN

## 2017-10-30 NOTE — Anesthesia Preprocedure Evaluation (Signed)
Anesthesia Evaluation  Patient identified by MRN, date of birth, ID band Patient awake    Reviewed: Allergy & Precautions, NPO status , Patient's Chart, lab work & pertinent test results  Airway Mallampati: I  TM Distance: >3 FB Neck ROM: Full    Dental no notable dental hx. (+) Teeth Intact   Pulmonary neg pulmonary ROS,    Pulmonary exam normal breath sounds clear to auscultation       Cardiovascular negative cardio ROS Normal cardiovascular exam Rhythm:Regular Rate:Normal     Neuro/Psych negative neurological ROS  negative psych ROS   GI/Hepatic negative GI ROS, Neg liver ROS,   Endo/Other  negative endocrine ROS  Renal/GU negative Renal ROS  negative genitourinary   Musculoskeletal negative musculoskeletal ROS (+)   Abdominal   Peds  Hematology negative hematology ROS (+)   Anesthesia Other Findings   Reproductive/Obstetrics (+) Pregnancy                             Anesthesia Physical Anesthesia Plan  ASA: II  Anesthesia Plan: Epidural   Post-op Pain Management:    Induction:   PONV Risk Score and Plan: Treatment may vary due to age or medical condition  Airway Management Planned: Natural Airway  Additional Equipment:   Intra-op Plan:   Post-operative Plan:   Informed Consent: I have reviewed the patients History and Physical, chart, labs and discussed the procedure including the risks, benefits and alternatives for the proposed anesthesia with the patient or authorized representative who has indicated his/her understanding and acceptance.       Plan Discussed with: Anesthesiologist  Anesthesia Plan Comments: (Patient identified. Risks, benefits, options discussed with patient including but not limited to bleeding, infection, nerve damage, paralysis, failed block, incomplete pain control, headache, blood pressure changes, nausea, vomiting, reactions to medication,  itching, and post partum back pain. Confirmed with bedside nurse the patient's most recent platelet count. Confirmed with the patient that they are not taking any anticoagulation, have any bleeding history or any family history of bleeding disorders. Patient expressed understanding and wishes to proceed. All questions were answered. )        Anesthesia Quick Evaluation  

## 2017-10-30 NOTE — MAU Note (Signed)
Pt reports contractions every 5 minutes. Pt denies vaginal bleeding or LOF. Reports good fetal movement. Cervix closed on last exam.

## 2017-10-30 NOTE — Progress Notes (Signed)
0900: Hien # [id number] [place] interpreter on stratus looking to see if [place] dialect is available to patient. Pt states she can speak and understand [place] as well. Pt stating that she is wanting family to interpret for her, trying to obtain consent.  [id number]: Pt states she will sign consent to have family interpret for her. Hien states to call [phone] in the future if family is unavailable to translate for her, as [place] dialect is available through that number

## 2017-10-30 NOTE — Progress Notes (Signed)
Just got epidural, still feeling ctx Afeb, VSS FHT- 130s, mod variability, + accels and scalp stim, early decels, ctx q 2-5 min, Cat I VE5/90/+1, vtx Continue to monitor progress, might need augmentation, continue PCN

## 2017-10-30 NOTE — H&P (Signed)
Sheilah PigeonKimmy J Ndram is a 22 y.o. female, G1P0, EGA 37+ weeks with EDC 8-31 presenting for ctx.  On initial eval in MAU, cervix closed and difficult to get to per RN, FHT maybe with some late decels per RN.  On recheck, VE 3 cm per a different RN.  Prenatal care essentially uncomplicated.  OB History    Gravida  1   Para      Term      Preterm      AB      Living        SAB      TAB      Ectopic      Multiple      Live Births             Past Medical History:  Diagnosis Date  . Medical history non-contributory    Past Surgical History:  Procedure Laterality Date  . NO PAST SURGERIES     Family History: family history is not on file. Social History:  reports that she has never smoked. She has never used smokeless tobacco. She reports that she does not drink alcohol or use drugs.     Maternal Diabetes: No Genetic Screening: Declined Maternal Ultrasounds/Referrals: Abnormal:  Findings:   Isolated EIF (echogenic intracardiac focus) Fetal Ultrasounds or other Referrals:  None Maternal Substance Abuse:  No Significant Maternal Medications:  None Significant Maternal Lab Results:  Lab values include: Group B Strep positive Other Comments:  None  Review of Systems  Respiratory: Negative.   Cardiovascular: Negative.    Maternal Medical History:  Reason for admission: Contractions.   Contractions: Frequency: regular.   Perceived severity is moderate.    Fetal activity: Perceived fetal activity is normal.    Prenatal complications: no prenatal complications Prenatal Complications - Diabetes: none.    Dilation: 3 Effacement (%): 90 Station: -2 Exam by:: jolynn Blood pressure (!) 133/96, pulse 89, temperature 97.9 F (36.6 C), temperature source Oral, resp. rate 20, height 5\' 4"  (1.626 m), weight 72.1 kg, last menstrual period 02/04/2017, SpO2 100 %. Maternal Exam:  Uterine Assessment: Contraction strength is moderate.  Contraction frequency is regular.    Abdomen: Patient reports no abdominal tenderness. Estimated fetal weight is 6 lbs.   Fetal presentation: vertex  Introitus: Normal vulva. Normal vagina.  Amniotic fluid character: not assessed.  Pelvis: adequate for delivery.   Cervix: Cervix evaluated by digital exam.     Fetal Exam Fetal Monitor Review: Mode: ultrasound.   Baseline rate: 130s.  Variability: moderate (6-25 bpm).   Pattern: accelerations present, early decelerations and variable decelerations.    Fetal State Assessment: Category II - tracings are indeterminate.     Physical Exam  Vitals reviewed. Constitutional: She appears well-developed and well-nourished.  Cardiovascular: Normal rate and regular rhythm.  Respiratory: Effort normal. No respiratory distress.  GI: Soft.    Prenatal labs: ABO, Rh:  O pos Antibody:  neg Rubella:  Immune RPR:   NR HBsAg:   neg HIV: Non Reactive (09/04 1157)  GBS:   positive  Assessment/Plan: IUP at 37+ weeks in early labor, GBS pos in urine.  Will admit and monitor progress, PCN for +GBS.   Leighton Roachodd D Kijana Estock 10/30/2017, 8:41 AM

## 2017-10-30 NOTE — Anesthesia Procedure Notes (Signed)
Epidural Patient location during procedure: OB Start time: 10/30/2017 12:12 PM End time: 10/30/2017 1:40 PM  Staffing Anesthesiologist: Elmer PickerWoodrum, Maxim Bedel L, MD Performed: anesthesiologist   Preanesthetic Checklist Completed: patient identified, pre-op evaluation, timeout performed, IV checked, risks and benefits discussed and monitors and equipment checked  Epidural Patient position: sitting Prep: site prepped and draped and DuraPrep Patient monitoring: continuous pulse ox, blood pressure, heart rate and cardiac monitor Approach: midline Location: L3-L4 Injection technique: LOR air  Needle:  Needle type: Tuohy  Needle gauge: 17 G Needle length: 9 cm Needle insertion depth: 4 cm Catheter type: closed end flexible Catheter size: 19 Gauge Catheter at skin depth: 9 cm Test dose: negative  Assessment Sensory level: T8 Events: blood not aspirated, injection not painful, no injection resistance, negative IV test and no paresthesia  Additional Notes Reason for block:procedure for pain

## 2017-10-30 NOTE — Anesthesia Pain Management Evaluation Note (Signed)
  CRNA Pain Management Visit Note  Patient: Carrie Boyd, 22 y.o., female  "Hello I am a member of the anesthesia team at [facility]. We have an anesthesia team available at all times to provide care throughout the hospital, including epidural management and anesthesia for C-section. I don't know your plan for the delivery whether it a natural birth, water birth, IV sedation, nitrous supplementation, doula or epidural, but we want to meet your pain goals."   1.Was your pain managed to your expectations on prior hospitalizations?   No prior hospitalizations  2.What is your expectation for pain management during this hospitalization?     Epidural and IV pain meds  3.How can we help you reach that goal? Possible epidural  Record the patient's initial score and the patient's pain goal.   Pain: 7  Pain Goal: 7 [facility] wants you to be able to say your pain was always managed very well.  Carrie Boyd 10/30/2017

## 2017-10-31 ENCOUNTER — Encounter (HOSPITAL_COMMUNITY): Payer: Self-pay | Admitting: *Deleted

## 2017-10-31 NOTE — Anesthesia Postprocedure Evaluation (Signed)
Anesthesia Post Note  Patient: Carrie Boyd  Procedure(s) Performed: AN AD HOC LABOR EPIDURAL     Patient location during evaluation: Mother Baby Anesthesia Type: Epidural Level of consciousness: awake and alert Pain management: pain level controlled Vital Signs Assessment: post-procedure vital signs reviewed and stable Respiratory status: spontaneous breathing Cardiovascular status: blood pressure returned to baseline Postop Assessment: no headache, no backache, epidural receding, able to ambulate, adequate PO intake, no apparent nausea or vomiting and patient able to bend at knees Anesthetic complications: no    Last Vitals:  Vitals:   10/31/17 0400 10/31/17 1522  BP: 106/67 113/68  Pulse: 79 72  Resp: 18   Temp: 36.8 C 36.8 C  SpO2: 100%     Last Pain:  Vitals:   10/31/17 1751  TempSrc:   PainSc: 5    Pain Goal: Patients Stated Pain Goal: 3 (10/31/17 1542)               Ahmoni Edge

## 2017-10-31 NOTE — Anesthesia Postprocedure Evaluation (Signed)
Anesthesia Post Note  Patient: Carrie Boyd  Procedure(s) Performed: AN AD HOC LABOR EPIDURAL     Patient location during evaluation: Mother Baby Anesthesia Type: Epidural Level of consciousness: awake and alert Pain management: pain level controlled Vital Signs Assessment: post-procedure vital signs reviewed and stable Respiratory status: spontaneous breathing, nonlabored ventilation and respiratory function stable Cardiovascular status: stable Postop Assessment: no headache, no backache and epidural receding Anesthetic complications: no    Last Vitals:  Vitals:   10/30/17 2300 10/31/17 0001  BP: 116/73 115/83  Pulse: 79 79  Resp: 18 18  Temp: 36.9 C 37.4 C  SpO2: 100% 100%    Last Pain:  Vitals:   10/31/17 0001  TempSrc:   PainSc: 0-No pain   Pain Goal:                 Darlys Buis

## 2017-10-31 NOTE — Lactation Note (Signed)
This note was copied from a baby's chart. Lactation Consultation Note  Patient Name: Carrie Boyd Today's Date: 10/31/2017  P1, 7 hr female infant. LC entered room, per dad mom  only wants to formula fed for now. Mom is asleep.  Family's feeding choice was breast and formula feed upon admission. LC will follow up w/ Mom when awake.    Maternal Data    Feeding    LATCH Score                   Interventions    Lactation Tools Discussed/Used     Consult Status      Danelle EarthlyRobin Caillou Minus 10/31/2017, 3:35 AM

## 2017-10-31 NOTE — Plan of Care (Signed)
Pt. Condition will continue to improve 

## 2017-10-31 NOTE — Progress Notes (Signed)
PPD #1 No problems Afeb, VSS Fundus firm, NT at U-1 Continue routine postpartum care 

## 2017-10-31 NOTE — Addendum Note (Signed)
Addendum  created 10/31/17 1810 by Jhonnie GarnerMarshall, Raydell Maners M, CRNA   Charge Capture section accepted, Sign clinical note

## 2017-11-01 MED ORDER — IBUPROFEN 600 MG PO TABS: 600.0000 mg | ORAL_TABLET | Freq: Four times a day (QID) | ORAL | 0 refills | Status: DC

## 2017-11-01 MED ORDER — OXYCODONE HCL 5 MG PO TABS: 5.0000 mg | ORAL_TABLET | ORAL | 0 refills | Status: DC | PRN

## 2017-11-01 NOTE — Progress Notes (Signed)
PPD #2 Doing ok, some pain Afeb, VSS Fundus firm D/c home

## 2017-11-01 NOTE — Discharge Summary (Signed)
OB Discharge Summary     Patient Name: Carrie Boyd DOB: 08/02/1995 MRN: [id number]  Date of admission: 10/30/2017 Delivering MD: Jackelyn KnifeMEISINGER, Lenix Kidd   Date of discharge: 11/01/2017  Admitting diagnosis: 37 WEEKS CRAMPING Intrauterine pregnancy: 7639w6d     Secondary diagnosis:  Active Problems:   Indication for care in labor or delivery   Vacuum extraction, delivered, current hospitalization      Discharge diagnosis: Term Pregnancy Delivered                                  Hospital course:  Onset of Labor With Vaginal Delivery     22 y.o. yo G1P1001 at 5639w6d was admitted in Latent Labor on 10/30/2017. Patient had an uncomplicated labor course as follows:  Membrane Rupture Time/Date: 8:10 AM ,10/30/2017   Intrapartum Procedures: Episiotomy: None [1]                                         Lacerations:  2nd degree [3]  Patient had a delivery of a Viable infant. 10/30/2017  Information for the patient's newborn:  Fontaine Nodram, Girl Roniya [[id number]][[id number]]  Delivery Method: Vaginal, Vacuum (Extractor)(Filed from Delivery Summary)    Pateint had an uncomplicated postpartum course.  She is ambulating, tolerating a regular diet, passing flatus, and urinating well. Patient is discharged home in stable condition on 11/01/17.   Physical exam  Vitals:   10/31/17 0400 10/31/17 1522 10/31/17 2205 11/01/17 0644  BP: 106/67 113/68 126/89 116/77  Pulse: 79 72 76 66  Resp: 18  18 18   Temp: 98.2 F (36.8 C) 98.2 F (36.8 C) 98.3 F (36.8 C) 98 F (36.7 C)  TempSrc:  Oral Oral   SpO2: 100%  100% 97%  Weight:      Height:       General: alert Lochia: appropriate Uterine Fundus: firm  Labs: Lab Results  Component Value Date   WBC 14.5 (H) 10/30/2017   HGB 12.8 10/30/2017   HCT 37.2 10/30/2017   MCV 71.5 (L) 10/30/2017   PLT 189 10/30/2017   CMP Latest Ref Rng & Units 03/20/2017  Glucose 65 - 99 mg/dL 80  BUN 6 - 20 mg/dL 8  Creatinine 7.820.57 - 9.561.00 mg/dL 2.13(Y0.56(L)  Sodium 865134 - 784144 mmol/L 137   Potassium 3.5 - 5.2 mmol/L 4.2  Chloride 96 - 106 mmol/L 102  CO2 20 - 29 mmol/L 20  Calcium 8.7 - 10.2 mg/dL 9.4  Total Protein 6.0 - 8.5 g/dL 7.3  Total Bilirubin 0.0 - 1.2 mg/dL 0.2  Alkaline Phos 39 - 117 IU/L 57  AST 0 - 40 IU/L 16  ALT 0 - 32 IU/L 18    Discharge instruction: per After Visit Summary and "Baby and Me Booklet".  After visit meds:  Allergies as of 11/01/2017   No Known Allergies     Medication List    TAKE these medications   ibuprofen 600 MG tablet Commonly known as:  ADVIL,MOTRIN Take 1 tablet (600 mg total) by mouth every 6 (six) hours.   oxyCODONE 5 MG immediate release tablet Commonly known as:  Oxy IR/ROXICODONE Take 1 tablet (5 mg total) by mouth every 4 (four) hours as needed for severe pain.       Diet: routine diet  Activity: Advance as tolerated. Pelvic rest for 6  weeks.   Outpatient follow up:6 weeks  Newborn Data: Live born female  Birth Weight: 7 lb 7.4 oz (3385 g) APGAR: 8, 9  Newborn Delivery   Birth date/time:  10/30/2017 19:49:00 Delivery type:  Vaginal, Vacuum (Extractor)     Baby Feeding: Bottle Disposition:home with mother   11/01/2017 Zenaida Nieceodd D Roselie Cirigliano, MD

## 2017-11-01 NOTE — Progress Notes (Signed)
CSW received consult for MOB due to her EPDS score of 14. CSW met with MOB at bedside to complete discussion. MOB reports that she felt very overwhelmed on Friday and Saturday due to just delivering her first child. MOB reports that she felt anxious because of everything happening so fast. MOB reports no anxiety or feelings of overwhelmed today. MOB reports good support from her spouse and FOB Loudy. MOB reports no concerns at this time. Newborn to stay one more night before being discharged, MOB to stay with baby. MOB denies a history of anxiety. CSW encouraged MOB to reach out for assistance if needs arise.  Madilyn Fireman, MSW, [place] Social Worker [facility] [phone]

## 2017-11-01 NOTE — Discharge Instructions (Signed)
As per discharge pamphlet °

## 2017-11-02 DIAGNOSIS — Z831 Family history of other infectious and parasitic diseases: Secondary | ICD-10-CM | POA: Diagnosis not present

## 2017-12-11 DIAGNOSIS — Z30430 Encounter for insertion of intrauterine contraceptive device: Secondary | ICD-10-CM | POA: Diagnosis not present

## 2018-02-09 DIAGNOSIS — Z30431 Encounter for routine checking of intrauterine contraceptive device: Secondary | ICD-10-CM | POA: Diagnosis not present

## 2018-05-10 DIAGNOSIS — R110 Nausea: Secondary | ICD-10-CM | POA: Diagnosis not present

## 2018-05-10 DIAGNOSIS — R319 Hematuria, unspecified: Secondary | ICD-10-CM | POA: Diagnosis not present

## 2018-05-19 DIAGNOSIS — R42 Dizziness and giddiness: Secondary | ICD-10-CM | POA: Diagnosis not present

## 2018-05-19 DIAGNOSIS — Z7689 Persons encountering health services in other specified circumstances: Secondary | ICD-10-CM | POA: Diagnosis not present

## 2018-05-19 DIAGNOSIS — R110 Nausea: Secondary | ICD-10-CM | POA: Diagnosis not present

## 2018-05-24 DIAGNOSIS — R110 Nausea: Secondary | ICD-10-CM | POA: Diagnosis not present

## 2018-05-24 DIAGNOSIS — R109 Unspecified abdominal pain: Secondary | ICD-10-CM | POA: Diagnosis not present

## 2018-05-24 DIAGNOSIS — R319 Hematuria, unspecified: Secondary | ICD-10-CM | POA: Diagnosis not present

## 2018-05-24 DIAGNOSIS — R112 Nausea with vomiting, unspecified: Secondary | ICD-10-CM | POA: Diagnosis not present

## 2018-05-24 DIAGNOSIS — Z975 Presence of (intrauterine) contraceptive device: Secondary | ICD-10-CM | POA: Diagnosis not present

## 2018-06-18 DIAGNOSIS — M26602 Left temporomandibular joint disorder, unspecified: Secondary | ICD-10-CM | POA: Diagnosis not present

## 2018-12-21 DIAGNOSIS — Z30431 Encounter for routine checking of intrauterine contraceptive device: Secondary | ICD-10-CM | POA: Diagnosis not present

## 2019-02-17 ENCOUNTER — Other Ambulatory Visit: Payer: Self-pay

## 2019-02-17 DIAGNOSIS — Z20822 Contact with and (suspected) exposure to covid-19: Secondary | ICD-10-CM

## 2019-02-20 LAB — NOVEL CORONAVIRUS, NAA: SARS-CoV-2, NAA: NOT DETECTED

## 2019-03-03 ENCOUNTER — Ambulatory Visit: Payer: Medicaid Other | Attending: Internal Medicine

## 2019-03-03 DIAGNOSIS — Z20828 Contact with and (suspected) exposure to other viral communicable diseases: Secondary | ICD-10-CM | POA: Diagnosis not present

## 2019-03-03 DIAGNOSIS — Z20822 Contact with and (suspected) exposure to covid-19: Secondary | ICD-10-CM

## 2019-03-04 LAB — NOVEL CORONAVIRUS, NAA: SARS-CoV-2, NAA: NOT DETECTED

## 2020-04-25 DIAGNOSIS — R112 Nausea with vomiting, unspecified: Secondary | ICD-10-CM | POA: Diagnosis not present

## 2020-04-25 DIAGNOSIS — R309 Painful micturition, unspecified: Secondary | ICD-10-CM | POA: Diagnosis not present

## 2020-05-14 DIAGNOSIS — R309 Painful micturition, unspecified: Secondary | ICD-10-CM | POA: Diagnosis not present

## 2020-05-14 DIAGNOSIS — R109 Unspecified abdominal pain: Secondary | ICD-10-CM | POA: Diagnosis not present

## 2020-05-16 ENCOUNTER — Other Ambulatory Visit: Payer: Self-pay | Admitting: Family

## 2020-05-16 DIAGNOSIS — R109 Unspecified abdominal pain: Secondary | ICD-10-CM

## 2020-05-28 ENCOUNTER — Ambulatory Visit
Admission: RE | Admit: 2020-05-28 | Discharge: 2020-05-28 | Disposition: A | Discharge status: Home / Self Care | Payer: Medicaid Other | Source: Ambulatory Visit | Attending: Family | Admitting: Family

## 2020-05-28 DIAGNOSIS — R109 Unspecified abdominal pain: Secondary | ICD-10-CM

## 2020-06-06 ENCOUNTER — Other Ambulatory Visit: Payer: Self-pay | Admitting: Urgent Care

## 2020-06-06 DIAGNOSIS — R309 Painful micturition, unspecified: Secondary | ICD-10-CM | POA: Diagnosis not present

## 2020-06-06 DIAGNOSIS — R509 Fever, unspecified: Secondary | ICD-10-CM | POA: Diagnosis not present

## 2020-06-06 DIAGNOSIS — R112 Nausea with vomiting, unspecified: Secondary | ICD-10-CM | POA: Diagnosis not present

## 2020-06-06 DIAGNOSIS — R1030 Lower abdominal pain, unspecified: Secondary | ICD-10-CM | POA: Diagnosis not present

## 2020-06-06 DIAGNOSIS — N941 Unspecified dyspareunia: Secondary | ICD-10-CM | POA: Diagnosis not present

## 2020-06-06 DIAGNOSIS — B952 Enterococcus as the cause of diseases classified elsewhere: Secondary | ICD-10-CM | POA: Diagnosis not present

## 2020-06-25 ENCOUNTER — Ambulatory Visit
Admission: RE | Admit: 2020-06-25 | Discharge: 2020-06-25 | Disposition: A | Discharge status: Home / Self Care | Payer: Medicaid Other | Source: Ambulatory Visit | Attending: Urgent Care | Admitting: Urgent Care

## 2020-06-25 DIAGNOSIS — R1030 Lower abdominal pain, unspecified: Secondary | ICD-10-CM

## 2020-07-02 DIAGNOSIS — N941 Unspecified dyspareunia: Secondary | ICD-10-CM | POA: Diagnosis not present

## 2020-07-17 DIAGNOSIS — R300 Dysuria: Secondary | ICD-10-CM | POA: Diagnosis not present

## 2020-07-17 DIAGNOSIS — N9089 Other specified noninflammatory disorders of vulva and perineum: Secondary | ICD-10-CM | POA: Diagnosis not present

## 2020-07-17 DIAGNOSIS — N760 Acute vaginitis: Secondary | ICD-10-CM | POA: Diagnosis not present

## 2021-01-15 DIAGNOSIS — Z30432 Encounter for removal of intrauterine contraceptive device: Secondary | ICD-10-CM | POA: Diagnosis not present

## 2021-05-08 ENCOUNTER — Other Ambulatory Visit: Payer: Self-pay

## 2021-05-08 ENCOUNTER — Encounter (HOSPITAL_COMMUNITY): Payer: Self-pay

## 2021-05-08 ENCOUNTER — Emergency Department (HOSPITAL_COMMUNITY)
Admission: EM | Admit: 2021-05-08 | Discharge: 2021-05-09 | Disposition: A | Discharge status: Home / Self Care | Payer: Medicaid Other | Attending: Emergency Medicine | Admitting: Emergency Medicine

## 2021-05-08 DIAGNOSIS — M549 Dorsalgia, unspecified: Secondary | ICD-10-CM | POA: Insufficient documentation

## 2021-05-08 DIAGNOSIS — R000 Tachycardia, unspecified: Secondary | ICD-10-CM | POA: Diagnosis not present

## 2021-05-08 DIAGNOSIS — R42 Dizziness and giddiness: Secondary | ICD-10-CM | POA: Diagnosis not present

## 2021-05-08 DIAGNOSIS — R0902 Hypoxemia: Secondary | ICD-10-CM | POA: Diagnosis not present

## 2021-05-08 DIAGNOSIS — N9489 Other specified conditions associated with female genital organs and menstrual cycle: Secondary | ICD-10-CM | POA: Insufficient documentation

## 2021-05-08 DIAGNOSIS — U071 COVID-19: Secondary | ICD-10-CM | POA: Diagnosis not present

## 2021-05-08 DIAGNOSIS — R519 Headache, unspecified: Secondary | ICD-10-CM | POA: Diagnosis present

## 2021-05-08 NOTE — ED Triage Notes (Addendum)
Patient arrives from home with complaint of lower back pain, leg cramps, headache, and vomiting, and dizziness x 1 day. Pt's O2 sats between 89-92% upon triage.

## 2021-05-09 ENCOUNTER — Emergency Department (HOSPITAL_COMMUNITY): Payer: Medicaid Other

## 2021-05-09 DIAGNOSIS — R42 Dizziness and giddiness: Secondary | ICD-10-CM | POA: Diagnosis not present

## 2021-05-09 DIAGNOSIS — R0902 Hypoxemia: Secondary | ICD-10-CM | POA: Diagnosis not present

## 2021-05-09 LAB — BASIC METABOLIC PANEL
Anion gap: 7 (ref 5–15)
BUN: 14 mg/dL (ref 6–20)
CO2: 22 mmol/L (ref 22–32)
Calcium: 9.1 mg/dL (ref 8.9–10.3)
Chloride: 106 mmol/L (ref 98–111)
Creatinine, Ser: 0.67 mg/dL (ref 0.44–1.00)
GFR, Estimated: >60 mL/min (ref >60)
Glucose, Bld: 116 mg/dL — ABNORMAL HIGH (ref 70–99)
Potassium: 3.5 mmol/L (ref 3.5–5.1)
Sodium: 135 mmol/L (ref 135–145)

## 2021-05-09 LAB — URINALYSIS, ROUTINE W REFLEX MICROSCOPIC
Bilirubin Urine: NEGATIVE
Glucose, UA: NEGATIVE mg/dL
Hgb urine dipstick: NEGATIVE
Ketones, ur: 20 mg/dL — AB
Leukocytes,Ua: NEGATIVE
Nitrite: NEGATIVE
Protein, ur: NEGATIVE mg/dL
Specific Gravity, Urine: 1.014 (ref 1.005–1.030)
pH: 7 (ref 5.0–8.0)

## 2021-05-09 LAB — RESP PANEL BY RT-PCR (FLU A&B, COVID) ARPGX2
Influenza A by PCR: NEGATIVE
Influenza B by PCR: NEGATIVE
SARS Coronavirus 2 by RT PCR: POSITIVE — AB

## 2021-05-09 LAB — LIPASE, BLOOD: Lipase: 27 U/L (ref 11–51)

## 2021-05-09 LAB — CBC
HCT: 43.2 % (ref 36.0–46.0)
Hemoglobin: 13.4 g/dL (ref 12.0–15.0)
MCH: 23 pg — ABNORMAL LOW (ref 26.0–34.0)
MCHC: 31 g/dL (ref 30.0–36.0)
MCV: 74.1 fL — ABNORMAL LOW (ref 80.0–100.0)
Platelets: 218 10*3/uL (ref 150–400)
RBC: 5.83 MIL/uL — ABNORMAL HIGH (ref 3.87–5.11)
RDW: 14.8 % (ref 11.5–15.5)
WBC: 8.7 10*3/uL (ref 4.0–10.5)
nRBC: 0 % (ref 0.0–0.2)

## 2021-05-09 LAB — I-STAT BETA HCG BLOOD, ED (MC, WL, AP ONLY): I-stat hCG, quantitative: <5 m[IU]/mL (ref <5)

## 2021-05-09 LAB — TROPONIN I (HIGH SENSITIVITY): Troponin I (High Sensitivity): <2 ng/L (ref <18)

## 2021-05-09 MED ORDER — ONDANSETRON HCL 4 MG/2ML IJ SOLN
4.0000 mg | Freq: Once | INTRAMUSCULAR | Status: AC
  Administered 2021-05-09: 4 mg via INTRAVENOUS
  Filled 2021-05-09: qty 2

## 2021-05-09 MED ORDER — KETOROLAC TROMETHAMINE 15 MG/ML IJ SOLN
15.0000 mg | Freq: Once | INTRAMUSCULAR | Status: AC
  Administered 2021-05-09: 15 mg via INTRAVENOUS
  Filled 2021-05-09: qty 1

## 2021-05-09 MED ORDER — SODIUM CHLORIDE 0.9 % IV BOLUS
1000.0000 mL | Freq: Once | INTRAVENOUS | Status: AC
  Administered 2021-05-09: 1000 mL via INTRAVENOUS

## 2021-05-09 NOTE — Discharge Instructions (Signed)
You were evaluated in the Emergency Department and after careful evaluation, we did not find any emergent condition requiring admission or further testing in the hospital.  Your exam/testing today was overall reassuring.  Symptoms seem to be due to COVID-19.  You tested positive here in the emergency department.  Recommend plenty of fluids, rest, Tylenol or Motrin for discomfort.  Please return to the Emergency Department if you experience any worsening of your condition.  Thank you for allowing [place] to be a part of your care.

## 2021-05-09 NOTE — ED Provider Notes (Signed)
[facility] Emergency Department Provider Note MRN:  [id number]  Arrival date & time: 05/09/21     Chief Complaint   Back Pain and Headache   History of Present Illness   Carrie Boyd is a 26 y.o. year-old female with no previous medical history presenting to the ED with chief complaint of back pain.  Pain to the bilateral flanks, also endorsing fever, headache, malaise, fatigue.  Denies dysuria or hematuria, no vaginal bleeding or discharge, no rash, no cough or cold-like symptoms.  Review of Systems  A thorough review of systems was obtained and all systems are negative except as noted in the HPI and PMH.   Patient's Health History    Past Medical History:  Diagnosis Date   Medical history non-contributory     Past Surgical History:  Procedure Laterality Date   NO PAST SURGERIES      History reviewed. No pertinent family history.  Social History   Socioeconomic History   Marital status: Single    Spouse name: Not on file   Number of children: Not on file   Years of education: Not on file   Highest education level: Not on file  Occupational History   Not on file  Tobacco Use   Smoking status: Never   Smokeless tobacco: Never  Vaping Use   Vaping Use: Never used  Substance and Sexual Activity   Alcohol use: No   Drug use: No   Sexual activity: Yes  Other Topics Concern   Not on file  Social History Narrative   Not on file   Social Determinants of Health   Financial Resource Strain: Not on file  Food Insecurity: Not on file  Transportation Needs: Not on file  Physical Activity: Not on file  Stress: Not on file  Social Connections: Not on file  Intimate Partner Violence: Not on file     Physical Exam   Vitals:   05/09/21 0120 05/09/21 0215  BP: 104/65 100/63  Pulse: (!) 103 96  Resp: (!) 25 20  Temp: 98.7 F (37.1 C)   SpO2: 99% 98%    CONSTITUTIONAL: Well-appearing, in mild distress due to pain NEURO/PSYCH:  Alert  and oriented x 3, no focal deficits EYES:  eyes equal and reactive ENT/NECK:  no LAD, no JVD CARDIO: Regular rate, well-perfused, normal S1 and S2 PULM:  CTAB no wheezing or rhonchi GI/GU:  non-distended, non-tender, mild bilateral CVA tenderness MSK/SPINE:  No gross deformities, no edema SKIN:  no rash, atraumatic   *Additional and/or pertinent findings included in MDM below  Diagnostic and Interventional Summary    EKG Interpretation  Date/Time:  Thursday May 09 2021 00:03:26 EST Ventricular Rate:  102 PR Interval:  117 QRS Duration: 81 QT Interval:  311 QTC Calculation: 405 R Axis:   78 Text Interpretation: Sinus tachycardia RSR' in V1 or V2, probably normal variant Confirmed by Gerlene Fee [phone]) on 05/09/2021 12:54:54 AM       Labs Reviewed  RESP PANEL BY RT-PCR (FLU A&B, COVID) ARPGX2 - Abnormal; Notable for the following components:      Result Value   SARS Coronavirus 2 by RT PCR POSITIVE (*)    All other components within normal limits  CBC - Abnormal; Notable for the following components:   RBC 5.83 (*)    MCV 74.1 (*)    MCH 23.0 (*)    All other components within normal limits  BASIC METABOLIC PANEL - Abnormal; Notable for the following  components:   Glucose, Bld 116 (*)    All other components within normal limits  URINALYSIS, ROUTINE W REFLEX MICROSCOPIC - Abnormal; Notable for the following components:   Ketones, ur 20 (*)    All other components within normal limits  LIPASE, BLOOD  I-STAT BETA HCG BLOOD, ED ([facility], [facility], [facility] ONLY)  TROPONIN I (HIGH SENSITIVITY)    DG Chest Port 1 View  Final Result      Medications  sodium chloride 0.9 % bolus 1,000 mL (0 mLs Intravenous Stopped 05/09/21 0306)  ketorolac (TORADOL) 15 MG/ML injection 15 mg (15 mg Intravenous Given 05/09/21 0041)  ondansetron (ZOFRAN) injection 4 mg (4 mg Intravenous Given 05/09/21 0041)     Procedures  /  Critical Care Procedures  ED Course and Medical Decision Making   Initial Impression and Ddx Fever, flank pain, tachycardia, suspect pyelonephritis.  Awaiting labs, urinalysis.  Past medical/surgical history that increases complexity of ED encounter: None  Interpretation of Diagnostics I personally reviewed the EKG and my interpretation is as follows: Sinus tachycardia    Labs reveal no significant blood count or electrolyte disturbance, patient has tested positive for COVID-19.  Urinalysis is normal.  Patient Reassessment and Ultimate Disposition/Management Patient looks well on reassessment, vital signs normal, symptoms do seem well explained by COVID-19.  Appropriate for discharge.  Patient management required discussion with the following services or consulting groups:  None  Complexity of Problems Addressed Acute illness or injury that poses threat of life of bodily function  Additional Data Reviewed and Analyzed Further history obtained from: Past medical history and medications listed in the EMR  Additional Factors Impacting ED Encounter Risk None  Barth Kirks. Sedonia Small, [place] [email or web address]  Final Clinical Impressions(s) / ED Diagnoses     ICD-10-CM   1. COVID-19  U07.1       ED Discharge Orders     None        Discharge Instructions Discussed with and Provided to Patient:     Discharge Instructions      You were evaluated in the Emergency Department and after careful evaluation, we did not find any emergent condition requiring admission or further testing in the hospital.  Your exam/testing today was overall reassuring.  Symptoms seem to be due to COVID-19.  You tested positive here in the emergency department.  Recommend plenty of fluids, rest, Tylenol or Motrin for discomfort.  Please return to the Emergency Department if you experience any worsening of your condition.  Thank you for allowing [place] to be a part of your care.        Maudie Flakes,  MD 05/09/21 [phone]

## 2021-05-10 ENCOUNTER — Telehealth: Payer: Self-pay

## 2021-05-10 NOTE — Telephone Encounter (Signed)
Transition Care Management Follow-up Telephone Call Date of discharge and from where: 05/09/2021 from [facility] How have you been since you were released from the hospital? Patient stated that she is still feeling bad. Patient stated that she has a headache and is taking tylenol and resting.  Any questions or concerns? No  Items Reviewed: Did the pt receive and understand the discharge instructions provided? Yes  Medications obtained and verified? Yes  Other? No  Any new allergies since your discharge? No  Dietary orders reviewed? No Do you have support at home? Yes   Functional Questionnaire: (I = Independent and D = Dependent) ADLs: I  Bathing/Dressing- I  Meal Prep- I  Eating- I  Maintaining continence- I  Transferring/Ambulation- I  Managing Meds- I   Follow up appointments reviewed:  PCP Hospital f/u appt confirmed? No   Specialist Hospital f/u appt confirmed? No   Are transportation arrangements needed? No  If their condition worsens, is the pt aware to call PCP or go to the Emergency Dept.? Yes Was the patient provided with contact information for the PCP's office or ED? Yes Was to pt encouraged to call back with questions or concerns? Yes

## 2021-08-06 DIAGNOSIS — N926 Irregular menstruation, unspecified: Secondary | ICD-10-CM | POA: Diagnosis not present

## 2021-08-06 DIAGNOSIS — Z124 Encounter for screening for malignant neoplasm of cervix: Secondary | ICD-10-CM | POA: Diagnosis not present

## 2021-08-06 DIAGNOSIS — Z1151 Encounter for screening for human papillomavirus (HPV): Secondary | ICD-10-CM | POA: Diagnosis not present

## 2021-08-06 DIAGNOSIS — N925 Other specified irregular menstruation: Secondary | ICD-10-CM | POA: Diagnosis not present

## 2021-08-13 DIAGNOSIS — N926 Irregular menstruation, unspecified: Secondary | ICD-10-CM | POA: Diagnosis not present

## 2021-12-31 DIAGNOSIS — N9489 Other specified conditions associated with female genital organs and menstrual cycle: Secondary | ICD-10-CM | POA: Diagnosis not present

## 2021-12-31 DIAGNOSIS — Z3040 Encounter for surveillance of contraceptives, unspecified: Secondary | ICD-10-CM | POA: Diagnosis not present

## 2021-12-31 DIAGNOSIS — N939 Abnormal uterine and vaginal bleeding, unspecified: Secondary | ICD-10-CM | POA: Diagnosis not present

## 2022-03-27 IMAGING — CT CT ABD-PELV W/O CM
2 of 4 series · 14 of 46 positions shown, 16 images · non-contrast
Comparison: None.

CLINICAL DATA: Right-sided flank pain and pelvic pain, initial
encounter

EXAM:
CT ABDOMEN AND PELVIS WITHOUT CONTRAST
TECHNIQUE: Multidetector CT imaging of the abdomen and pelvis was performed
following the standard protocol without IV contrast.

[Series 2: routine abdomen pelvis without 5.00 br40 s3 axial · axial · non-contrast · 0.60mm/px · z∈[+1248,+1608]mm · 11 of 88 slices shown, 13 images]
[im 8/88  soft-tissue]
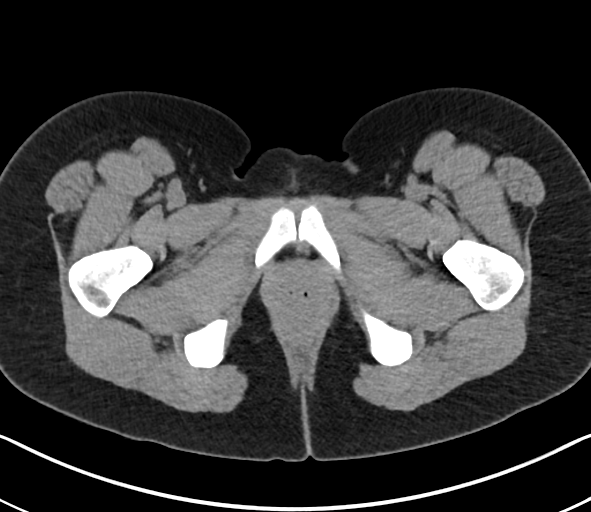
[im 8/88  bone]
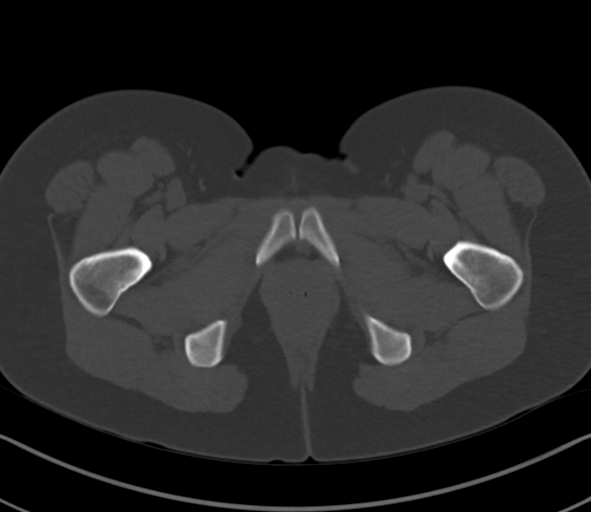
[im 15/88  soft-tissue]
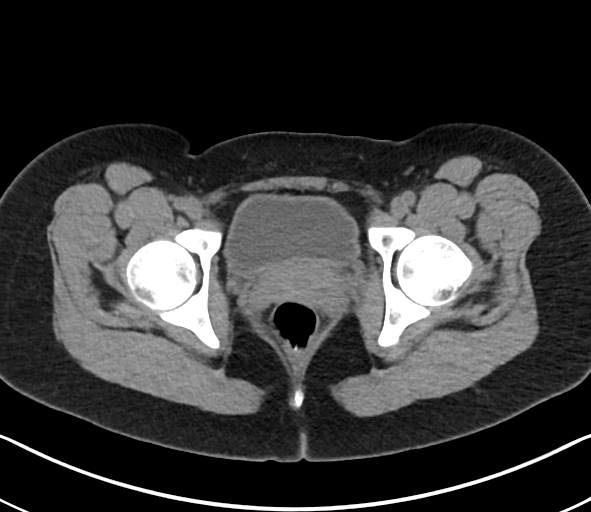
[im 22/88  soft-tissue]
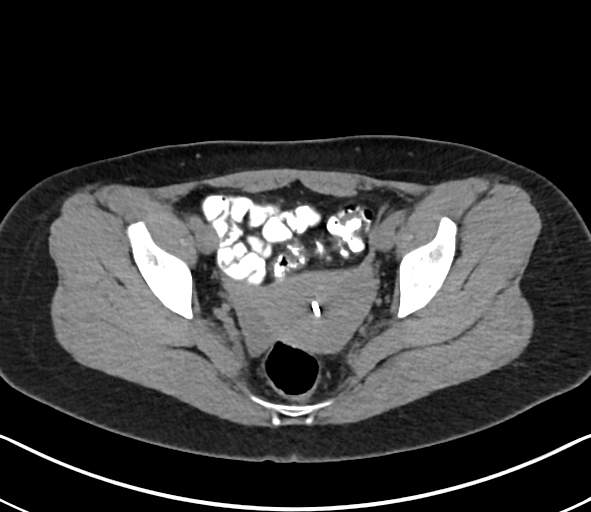
[im 30/88  soft-tissue]
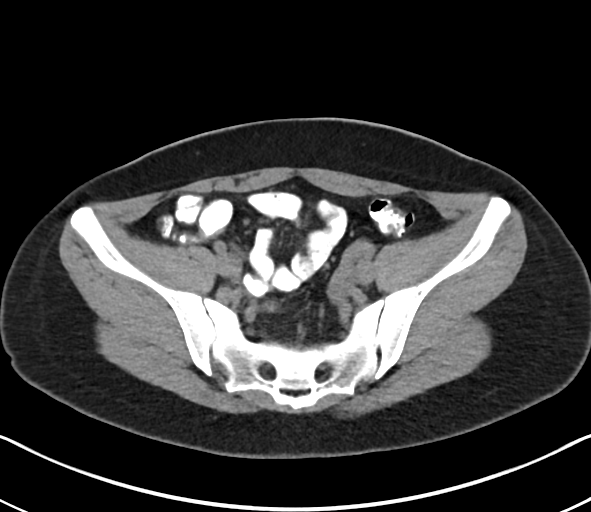
[im 37/88  soft-tissue]
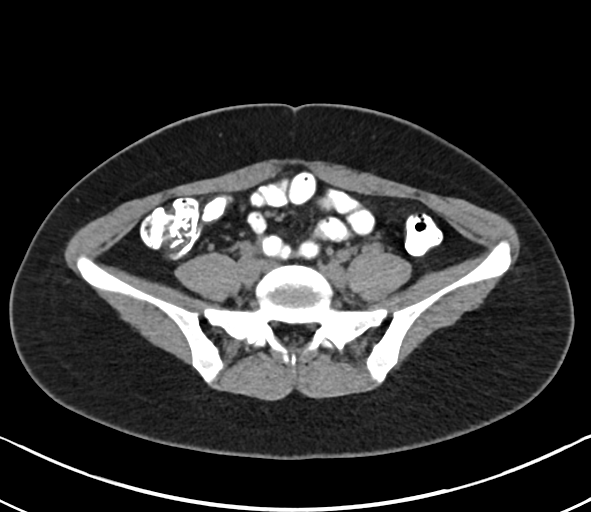
[im 44/88  soft-tissue]
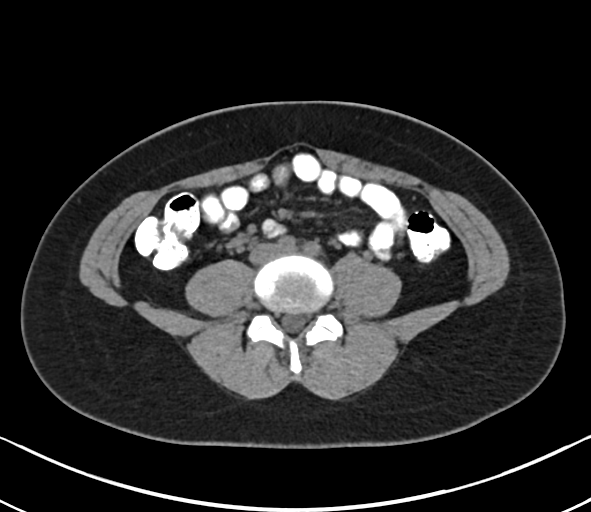
[im 51/88  soft-tissue]
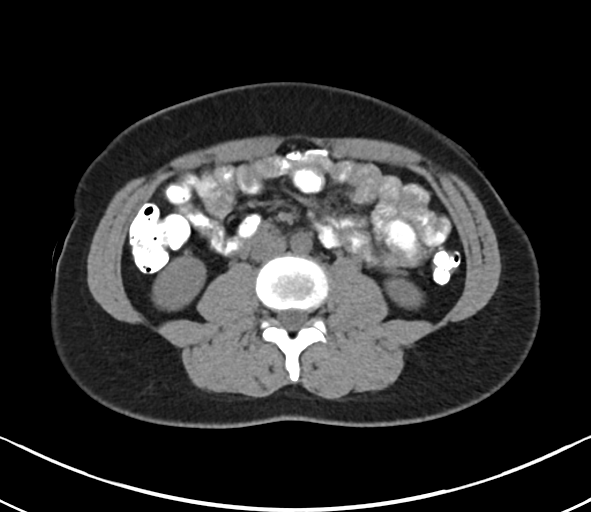
[im 59/88  soft-tissue]
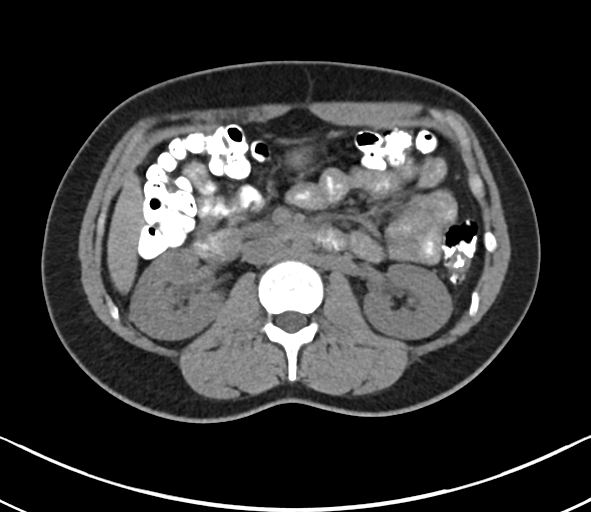
[im 66/88  soft-tissue]
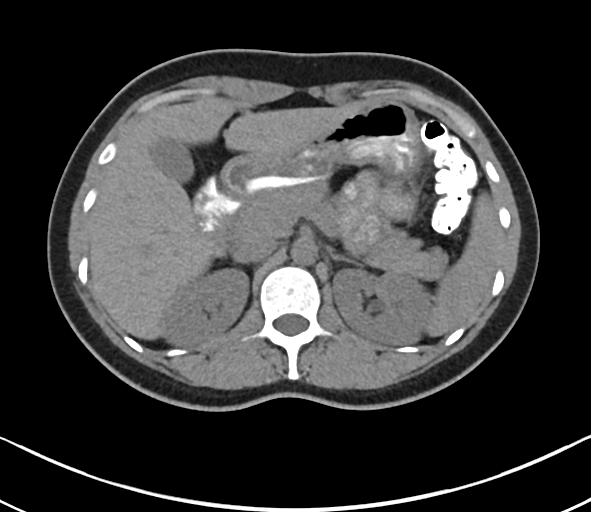
[im 66/88  bone]
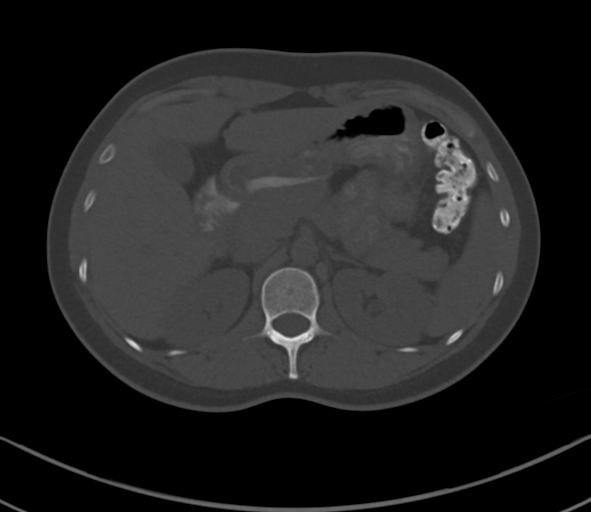
[im 73/88  soft-tissue]
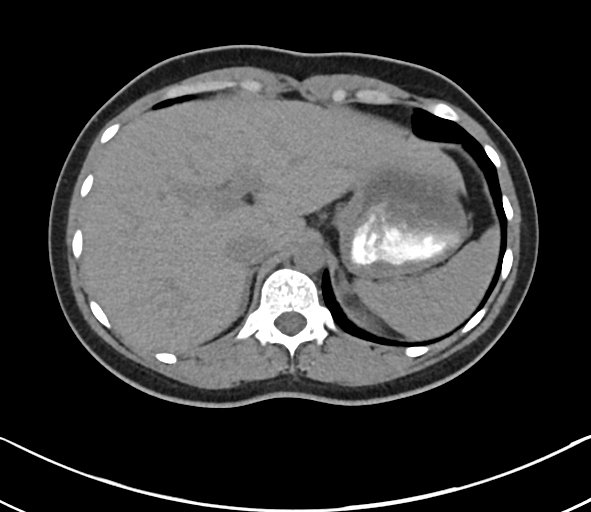
[im 80/88  soft-tissue]
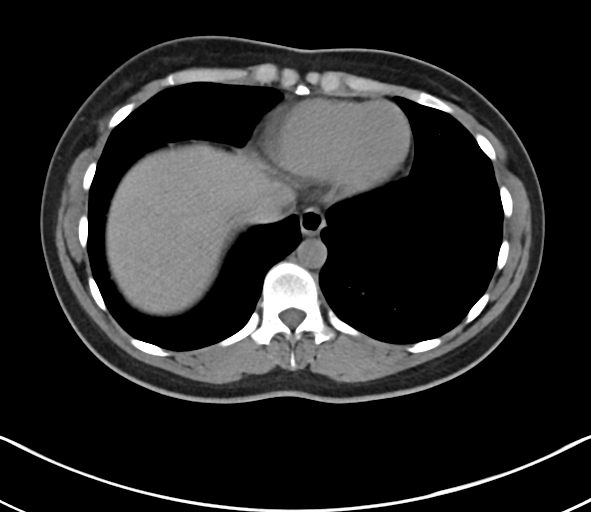

[Series 4: routine abdomen pelvis without 2.00 br40 s3 cor · coronal · non-contrast · 0.69mm/px · 3 of 152 slices shown]
[im 51/152  soft-tissue]
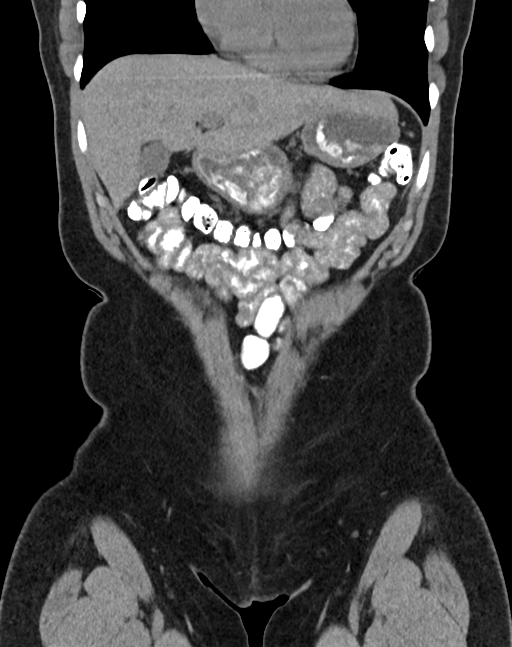
[im 68/152  soft-tissue]
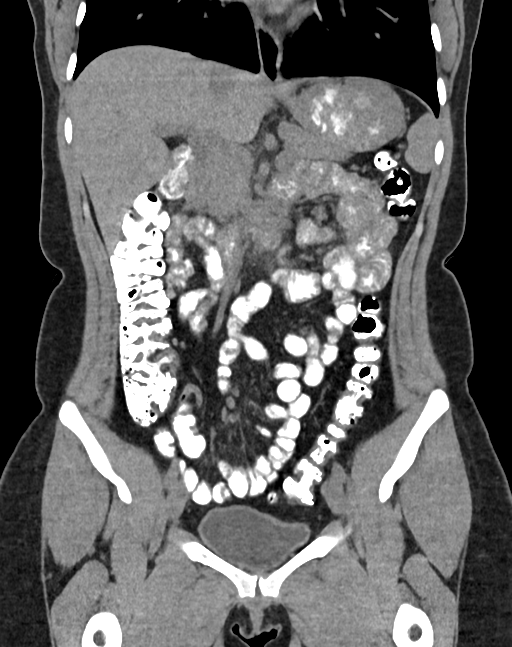
[im 84/152  soft-tissue]
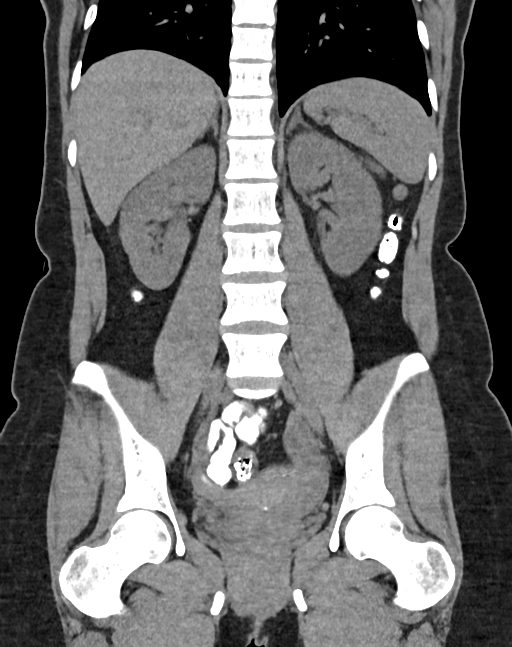

[14 of 46 positions shown; findings below may reference images not displayed]

FINDINGS: Lower chest: No acute abnormality.

Hepatobiliary: No focal liver abnormality is seen. No gallstones,
gallbladder wall thickening, or biliary dilatation.

Pancreas: Unremarkable. No pancreatic ductal dilatation or
surrounding inflammatory changes.

Spleen: Normal in size without focal abnormality.

Adrenals/Urinary Tract: Adrenal glands and kidneys appear within
normal limits. No obstructive changes are seen. Bladder is partially
distended.

Stomach/Bowel: No obstructive or inflammatory changes of the bowel
are noted. The appendix is well visualized and within normal limits.
No inflammatory changes are seen. Small bowel and stomach appear
within normal limits.

Vascular/Lymphatic: No significant vascular findings are present. No
enlarged abdominal or pelvic lymph nodes.

Reproductive: Uterus and bilateral adnexa are unremarkable. IUD is
noted in place.

Other: No abdominal wall hernia or abnormality. No abdominopelvic
ascites.

Musculoskeletal: No acute or significant osseous findings.
IMPRESSION: No acute abnormality identified to correspond with the patient's
given clinical history.

## 2022-04-28 ENCOUNTER — Encounter: Payer: Medicaid Other | Admitting: Obstetrics and Gynecology

## 2022-06-11 DIAGNOSIS — Z0000 Encounter for general adult medical examination without abnormal findings: Secondary | ICD-10-CM | POA: Diagnosis not present

## 2022-06-11 DIAGNOSIS — R300 Dysuria: Secondary | ICD-10-CM | POA: Diagnosis not present

## 2022-06-11 DIAGNOSIS — R350 Frequency of micturition: Secondary | ICD-10-CM | POA: Diagnosis not present

## 2022-12-26 DIAGNOSIS — R059 Cough, unspecified: Secondary | ICD-10-CM | POA: Diagnosis not present

## 2022-12-26 DIAGNOSIS — Z6827 Body mass index (BMI) 27.0-27.9, adult: Secondary | ICD-10-CM | POA: Diagnosis not present

## 2022-12-26 DIAGNOSIS — J069 Acute upper respiratory infection, unspecified: Secondary | ICD-10-CM | POA: Diagnosis not present

## 2022-12-26 DIAGNOSIS — Z3201 Encounter for pregnancy test, result positive: Secondary | ICD-10-CM | POA: Diagnosis not present

## 2022-12-26 DIAGNOSIS — O219 Vomiting of pregnancy, unspecified: Secondary | ICD-10-CM | POA: Diagnosis not present

## 2023-01-02 DIAGNOSIS — Z3481 Encounter for supervision of other normal pregnancy, first trimester: Secondary | ICD-10-CM | POA: Diagnosis not present

## 2023-01-02 DIAGNOSIS — Z369 Encounter for antenatal screening, unspecified: Secondary | ICD-10-CM | POA: Diagnosis not present

## 2023-01-02 DIAGNOSIS — Z113 Encounter for screening for infections with a predominantly sexual mode of transmission: Secondary | ICD-10-CM | POA: Diagnosis not present

## 2023-01-02 DIAGNOSIS — Z3A09 9 weeks gestation of pregnancy: Secondary | ICD-10-CM | POA: Diagnosis not present

## 2023-01-02 DIAGNOSIS — O26891 Other specified pregnancy related conditions, first trimester: Secondary | ICD-10-CM | POA: Diagnosis not present

## 2023-01-05 LAB — OB RESULTS CONSOLE GC/CHLAMYDIA
Chlamydia: NEGATIVE
Neisseria Gonorrhea: NEGATIVE

## 2023-01-09 LAB — OB RESULTS CONSOLE HIV ANTIBODY (ROUTINE TESTING): HIV: NONREACTIVE

## 2023-01-09 LAB — OB RESULTS CONSOLE HEPATITIS B SURFACE ANTIGEN: Hepatitis B Surface Ag: NEGATIVE

## 2023-01-09 LAB — OB RESULTS CONSOLE VARICELLA ZOSTER ANTIBODY, IGG: Varicella: IMMUNE

## 2023-01-09 LAB — OB RESULTS CONSOLE RUBELLA ANTIBODY, IGM: Rubella: IMMUNE

## 2023-01-27 DIAGNOSIS — Z3481 Encounter for supervision of other normal pregnancy, first trimester: Secondary | ICD-10-CM | POA: Diagnosis not present

## 2023-03-09 DIAGNOSIS — Z363 Encounter for antenatal screening for malformations: Secondary | ICD-10-CM | POA: Diagnosis not present

## 2023-03-09 DIAGNOSIS — Z3482 Encounter for supervision of other normal pregnancy, second trimester: Secondary | ICD-10-CM | POA: Diagnosis not present

## 2023-03-09 DIAGNOSIS — Z3A18 18 weeks gestation of pregnancy: Secondary | ICD-10-CM | POA: Diagnosis not present

## 2023-03-18 NOTE — L&D Delivery Note (Signed)
 DELIVERY NOTE  Pt complete and at +2 station with urge to push. Epidural controlling pain. Pt pushed and delivered a viable female infant in LOA position. Anterior and posterior shoulders spontaneously delivered with next two pushes; body easily followed next. Infant placed on mothers abdomen and bulb suction of mouth and nose performed. Cord was then clamped and cut by MD. Cord blood obtained, 3VC. Baby had a vigorous spontaneous cry noted. Placenta then delivered at 0351 intact. Fundal massage performed and pitocin  per protocol. LGA appearing, TXA and methergine  given. Fundus firm. The following lacerations were noted: 2nd deg. Repaired in routine fashion with 2-0 vicryl, EBL 500cc, QBL pending. Mother and baby stable. Counts correct   Infant time: 49 Gender: female, declines cird Placenta time: 0351 Apgars: 9/9 Weight: 3800g

## 2023-05-11 DIAGNOSIS — Z3689 Encounter for other specified antenatal screening: Secondary | ICD-10-CM | POA: Diagnosis not present

## 2023-05-11 DIAGNOSIS — Z3483 Encounter for supervision of other normal pregnancy, third trimester: Secondary | ICD-10-CM | POA: Diagnosis not present

## 2023-05-11 DIAGNOSIS — Z3A27 27 weeks gestation of pregnancy: Secondary | ICD-10-CM | POA: Diagnosis not present

## 2023-07-29 ENCOUNTER — Inpatient Hospital Stay (HOSPITAL_COMMUNITY)
Admission: AD | Admit: 2023-07-29 | Discharge: 2023-07-29 | Disposition: A | Discharge status: Home / Self Care | Source: Home / Self Care | Attending: Obstetrics and Gynecology | Admitting: Obstetrics and Gynecology

## 2023-07-29 ENCOUNTER — Inpatient Hospital Stay (HOSPITAL_COMMUNITY): Admitting: Anesthesiology

## 2023-07-29 ENCOUNTER — Encounter (HOSPITAL_COMMUNITY): Payer: Self-pay | Admitting: *Deleted

## 2023-07-29 ENCOUNTER — Encounter (HOSPITAL_COMMUNITY): Payer: Self-pay | Admitting: Obstetrics and Gynecology

## 2023-07-29 ENCOUNTER — Other Ambulatory Visit: Payer: Self-pay

## 2023-07-29 ENCOUNTER — Inpatient Hospital Stay (HOSPITAL_COMMUNITY)
Admission: AD | Admit: 2023-07-29 | Discharge: 2023-07-31 | DRG: 806 | Disposition: A | Discharge status: Home / Self Care | Attending: Obstetrics and Gynecology | Admitting: Obstetrics and Gynecology

## 2023-07-29 DIAGNOSIS — O99824 Streptococcus B carrier state complicating childbirth: Secondary | ICD-10-CM | POA: Diagnosis present

## 2023-07-29 DIAGNOSIS — O9882 Other maternal infectious and parasitic diseases complicating childbirth: Secondary | ICD-10-CM | POA: Diagnosis not present

## 2023-07-29 DIAGNOSIS — O471 False labor at or after 37 completed weeks of gestation: Secondary | ICD-10-CM | POA: Insufficient documentation

## 2023-07-29 DIAGNOSIS — Z3A38 38 weeks gestation of pregnancy: Secondary | ICD-10-CM | POA: Diagnosis not present

## 2023-07-29 DIAGNOSIS — O3663X0 Maternal care for excessive fetal growth, third trimester, not applicable or unspecified: Principal | ICD-10-CM | POA: Diagnosis present

## 2023-07-29 DIAGNOSIS — R030 Elevated blood-pressure reading, without diagnosis of hypertension: Secondary | ICD-10-CM | POA: Diagnosis not present

## 2023-07-29 DIAGNOSIS — Z3A39 39 weeks gestation of pregnancy: Secondary | ICD-10-CM | POA: Diagnosis not present

## 2023-07-29 DIAGNOSIS — O26893 Other specified pregnancy related conditions, third trimester: Secondary | ICD-10-CM | POA: Diagnosis not present

## 2023-07-29 LAB — CBC
HCT: 38 % (ref 36.0–46.0)
Hemoglobin: 11.9 g/dL — ABNORMAL LOW (ref 12.0–15.0)
MCH: 21.5 pg — ABNORMAL LOW (ref 26.0–34.0)
MCHC: 31.3 g/dL (ref 30.0–36.0)
MCV: 68.6 fL — ABNORMAL LOW (ref 80.0–100.0)
Platelets: 205 10*3/uL (ref 150–400)
RBC: 5.54 MIL/uL — ABNORMAL HIGH (ref 3.87–5.11)
RDW: 18.7 % — ABNORMAL HIGH (ref 11.5–15.5)
WBC: 9.6 10*3/uL (ref 4.0–10.5)
nRBC: 0 % (ref 0.0–0.2)

## 2023-07-29 MED ORDER — LACTATED RINGERS IV SOLN: 500.0000 mL | INTRAVENOUS | Status: DC | PRN

## 2023-07-29 MED ORDER — LIDOCAINE HCL (PF) 1 % IJ SOLN
INTRAMUSCULAR | Status: DC | PRN
  Administered 2023-07-29: 5 mL via EPIDURAL

## 2023-07-29 MED ORDER — OXYTOCIN-SODIUM CHLORIDE 30-0.9 UT/500ML-% IV SOLN
1.0000 m[IU]/min | INTRAVENOUS | Status: DC
  Filled 2023-07-29: qty 500

## 2023-07-29 MED ORDER — ACETAMINOPHEN 325 MG PO TABS: 650.0000 mg | ORAL_TABLET | ORAL | Status: DC | PRN

## 2023-07-29 MED ORDER — SOD CITRATE-CITRIC ACID 500-334 MG/5ML PO SOLN: 30.0000 mL | ORAL | Status: DC | PRN

## 2023-07-29 MED ORDER — PENICILLIN G POT IN DEXTROSE 60000 UNIT/ML IV SOLN: 3.0000 10*6.[IU] | INTRAVENOUS | Status: DC

## 2023-07-29 MED ORDER — LIDOCAINE HCL (PF) 1 % IJ SOLN: 30.0000 mL | INTRAMUSCULAR | Status: DC | PRN

## 2023-07-29 MED ORDER — SODIUM CHLORIDE 0.9 % IV SOLN: 5.0000 10*6.[IU] | Freq: Once | INTRAVENOUS | Status: DC

## 2023-07-29 MED ORDER — PHENYLEPHRINE 80 MCG/ML (10ML) SYRINGE FOR IV PUSH (FOR BLOOD PRESSURE SUPPORT)
80.0000 ug | PREFILLED_SYRINGE | INTRAVENOUS | Status: DC | PRN
  Filled 2023-07-29: qty 10

## 2023-07-29 MED ORDER — EPHEDRINE 5 MG/ML INJ: 10.0000 mg | INTRAVENOUS | Status: DC | PRN

## 2023-07-29 MED ORDER — LACTATED RINGERS IV SOLN
500.0000 mL | Freq: Once | INTRAVENOUS | Status: AC
  Administered 2023-07-29: 500 mL via INTRAVENOUS

## 2023-07-29 MED ORDER — DIPHENHYDRAMINE HCL 50 MG/ML IJ SOLN: 12.5000 mg | INTRAMUSCULAR | Status: DC | PRN

## 2023-07-29 MED ORDER — PHENYLEPHRINE 80 MCG/ML (10ML) SYRINGE FOR IV PUSH (FOR BLOOD PRESSURE SUPPORT): 80.0000 ug | PREFILLED_SYRINGE | INTRAVENOUS | Status: DC | PRN

## 2023-07-29 MED ORDER — ONDANSETRON HCL 4 MG/2ML IJ SOLN
4.0000 mg | Freq: Four times a day (QID) | INTRAMUSCULAR | Status: DC | PRN
  Administered 2023-07-30: 4 mg via INTRAVENOUS
  Filled 2023-07-29: qty 2

## 2023-07-29 MED ORDER — OXYTOCIN-SODIUM CHLORIDE 30-0.9 UT/500ML-% IV SOLN
2.5000 [IU]/h | INTRAVENOUS | Status: DC
  Administered 2023-07-30: 2.5 [IU]/h via INTRAVENOUS

## 2023-07-29 MED ORDER — FENTANYL CITRATE (PF) 100 MCG/2ML IJ SOLN
50.0000 ug | INTRAMUSCULAR | Status: DC | PRN
  Administered 2023-07-29: 100 ug via INTRAVENOUS
  Filled 2023-07-29: qty 2

## 2023-07-29 MED ORDER — OXYCODONE-ACETAMINOPHEN 5-325 MG PO TABS: 2.0000 | ORAL_TABLET | ORAL | Status: DC | PRN

## 2023-07-29 MED ORDER — OXYCODONE-ACETAMINOPHEN 5-325 MG PO TABS: 1.0000 | ORAL_TABLET | ORAL | Status: DC | PRN

## 2023-07-29 MED ORDER — SODIUM CHLORIDE 0.9 % IV SOLN
2.0000 g | Freq: Once | INTRAVENOUS | Status: AC
  Administered 2023-07-29: 2 g via INTRAVENOUS
  Filled 2023-07-29: qty 2000

## 2023-07-29 MED ORDER — FENTANYL-BUPIVACAINE-NACL 0.5-0.125-0.9 MG/250ML-% EP SOLN
12.0000 mL/h | EPIDURAL | Status: DC | PRN
  Administered 2023-07-29: 12 mL/h via EPIDURAL
  Filled 2023-07-29: qty 250

## 2023-07-29 MED ORDER — SODIUM CHLORIDE 0.9 % IV SOLN
1.0000 g | INTRAVENOUS | Status: DC
  Administered 2023-07-30: 1 g via INTRAVENOUS
  Filled 2023-07-29: qty 1000

## 2023-07-29 MED ORDER — LACTATED RINGERS IV SOLN: INTRAVENOUS | Status: DC

## 2023-07-29 MED ORDER — TERBUTALINE SULFATE 1 MG/ML IJ SOLN: 0.2500 mg | Freq: Once | INTRAMUSCULAR | Status: DC | PRN

## 2023-07-29 MED ORDER — OXYTOCIN BOLUS FROM INFUSION
333.0000 mL | Freq: Once | INTRAVENOUS | Status: AC
  Administered 2023-07-30: 333 mL via INTRAVENOUS

## 2023-07-29 NOTE — H&P (Signed)
 Carrie Boyd is a 28 y.o. female presenting for evaluation in contractions. Came to MAU earlier today and found to be 3 then 4cm over three hrs. Has IOLD tomorrow for LGA at term. Sent home but returned 2/2 stronger contractions. +FM, denies VB, LOF  PNC c/b 1) H/o VAVD - 2/2 poor maternal effort 2) LGA infant - GS today EFW 3979g (90%), AC >99%, AFI 18, cephalic 3) GBS bacteriuria  OB History     Gravida  2   Para  1   Term  1   Preterm      AB      Living  1      SAB      IAB      Ectopic      Multiple  0   Live Births  1          Past Medical History:  Diagnosis Date   Medical history non-contributory    Past Surgical History:  Procedure Laterality Date   NO PAST SURGERIES     Family History: family history is not on file. Social History:  reports that she has never smoked. She has never used smokeless tobacco. She reports that she does not drink alcohol and does not use drugs.     Maternal Diabetes: No1hr 62 Genetic Screening: Normal Maternal Ultrasounds/Referrals: Normal Fetal Ultrasounds or other Referrals:  None Maternal Substance Abuse:  No Significant Maternal Medications:  None Significant Maternal Lab Results:  Group B Strep positive Number of Prenatal Visits:greater than 3 verified prenatal visits Maternal Vaccinations:none Other Comments:  None  Review of Systems  Constitutional:  Negative for chills and fever.  Respiratory:  Negative for shortness of breath.   Cardiovascular:  Negative for chest pain, palpitations and leg swelling.  Gastrointestinal:  Positive for abdominal pain (ctx). Negative for nausea and vomiting.  Neurological:  Negative for dizziness, weakness and headaches.  Psychiatric/Behavioral:  Negative for suicidal ideas.    Maternal Medical History:  Reason for admission: Contractions.  Nausea.  Contractions: Onset was 1-2 hours ago.   Frequency: regular.   Perceived severity is strong.   Fetal activity:  Perceived fetal activity is normal.   Prenatal complications: No bleeding or IUGR.   Prenatal Complications - Diabetes: none.   Dilation: 5.5 Effacement (%): 80 Station: -2 Exam by:: Carrie Gardener, RN Blood pressure 138/86, pulse 97, temperature 97.9 F (36.6 C), temperature source Oral, resp. rate 19, height 5\' 4"  (1.626 m), weight 88.5 kg, SpO2 100%, unknown if currently breastfeeding. Exam Physical Exam Constitutional:      General: She is not in acute distress.    Appearance: She is well-developed.  HENT:     Head: Normocephalic and atraumatic.  Eyes:     Pupils: Pupils are equal, round, and reactive to light.  Cardiovascular:     Rate and Rhythm: Normal rate and regular rhythm.     Heart sounds: No murmur heard.    No gallop.  Abdominal:     Tenderness: There is no abdominal tenderness. There is no guarding or rebound.  Genitourinary:    Vagina: Normal.  Musculoskeletal:        General: Normal range of motion.     Cervical back: Normal range of motion and neck supple.  Skin:    General: Skin is warm and dry.  Neurological:     Mental Status: She is alert and oriented to person, place, and time.     Prenatal labs: ABO, Rh:  Opos  Antibody:  neg Rubella:  Imm RPR:   nr HBsAg:   neg HIV:   nr GBS:   POS  Cat 1 tracing TOCO q72min  Assessment/Plan: This is a 27yo G2P1001 @ 38 6/7 now in active labor. GBS pos, LGA infant on scan today. VAVD 2/2 poor maternal effort. Last baby 7lb7oz, anticipate SVD   Carrie Boyd 07/29/2023, 9:54 PM

## 2023-07-29 NOTE — MAU Provider Note (Signed)
 Ms. Carrie Boyd is a G2P1001 at [redacted]w[redacted]d seen in MAU for labor. RN labor check, not seen by provider.   SVE by RN Dilation: 4 Effacement (%): 80 Station: -3 Exam by:: Rockne Chyle, RN  Unchanged x 2 hours  NST - FHR: 150 bpm / moderate variability / accels present / decels absent / TOCO: irregular every 2-8 mins   Plan: D/C home with labor precautions Keep scheduled appt with L&D on 07/30/2023  Almond Army, CNM  07/29/2023 8:00 PM

## 2023-07-29 NOTE — MAU Note (Signed)
 Pt informed that the ultrasound is considered a limited OB ultrasound and is not intended to be a complete ultrasound exam.  Patient also informed that the ultrasound is not being completed with the intent of assessing for fetal or placental anomalies or any pelvic abnormalities.  Explained that the purpose of today's ultrasound is to assess for  presentation..  Patient acknowledges the purpose of the exam and the limitations of the study.     Verified vertex by RN

## 2023-07-29 NOTE — MAU Note (Signed)
 Carrie Boyd is a 28 y.o. at [redacted]w[redacted]d here in MAU reporting: was just here in MAU but as soon as she was discharged and got home, the pain got worse. 8/10 ctxs pain.  +FM, no lOF.  Ctxs-3-4 min apart.  No bleeding currently, but was bleeding this morning.  Desires epidural   FHT: 140  Lab orders placed from triage: Labor eval Vitals:   07/29/23 2136  BP: 138/86  Pulse: 97  Resp: 19  Temp: 97.9 F (36.6 C)  SpO2: 100%

## 2023-07-29 NOTE — Discharge Instructions (Signed)
 [employer] This circuit takes at least 90 minutes to complete so clear your schedule and make mental preparations so you can relax in your environment.  The second step requires a lot of pillows so gather them up before beginning.  Before starting, you should empty your bladder! Have a nice drink nearby, and make sure it has a straw! If you are having contractions, this circuit should be done through contractions, try not to change positions between steps.  Step One: Open-kneeChest Stay in this position for 30 minutes, start in cat/cow, then drop your chest as low as you can to the bed or the floor and your bottom as high as you can. Knees should be fairly wide apart, and the angle between the torso/thighs should be wider than 90 degrees. Wiggle around, prop with lots of pillows and use this time to get totally relaxed. This position allows the baby to scoot out of the pelvis a bit and gives them room to rotate, shift their head position, etc. If the pregnant person finds it helpful, careful positioning with a rebozo under the belly, with gentle tension from a support person behind can help maintain this position for the full 30 minutes.  Step BJY:[id number] SideLying Roll to your left side, bringing your top leg as high as possible and keeping your bottom leg straight. Roll forward as much as possible, again using a lot of pillows. Sink into the bed and relax some more. If you fall asleep, that's totally okay and you can stay there! If not, stay here for at least another half an hour. Try and get your top right leg up towards your head and get as rolled over onto your belly as much as possible. If you repeat the circuit during labor, try alternating left and right sides. We know the photo the left is actually right side... just flip the image in your head.  Step Three: Moving and Lunges Lunge, walk stairs facing sideways, 2 at a time, (have a spotter  downstairs of you!), take a walk outside with one foot on the curb and the other on the street, sit on a birth ball and hula- anything that's upright and putting your pelvis in open, asymmetrical positions. Spend at least 30 minutes doing this one as well to give your baby a chance to move down. If you are lunging or stair or curb walking, you should lunge/walk/go up stairs in the direction that feels better to you. The key with the lunge is that the toes of the higher leg and mom's belly button should be at right angles. Do not lunge over your knee, that closes the pelvis.  You got this!!!!!!!!!!!!!!!!!!!!!!! :)

## 2023-07-29 NOTE — MAU Note (Signed)
 Carrie Boyd is a 28 y.o. at [redacted]w[redacted]d here in MAU reporting: she been having ctxs since this morning @ 0800, reports ctxs are every 10 minutes.  Denies LOF, has mucus bloody discharge.  Endorses +FM.   Reports ctxs have worsened since OB visit 2 1300.  LMP: NA Onset of complaint: today Pain score: 7 Vitals:   07/29/23 1653  BP: 130/82  Pulse: 100  Resp: 18  Temp: 97.9 F (36.6 C)  SpO2: 97%     FHT: deferred secondary maternal apparel  Lab orders placed from triage: None

## 2023-07-29 NOTE — Anesthesia Preprocedure Evaluation (Signed)
 Anesthesia Evaluation  Patient identified by MRN, date of birth, ID band Patient awake    Reviewed: Allergy & Precautions, NPO status , Patient's Chart, lab work & pertinent test results  Airway Mallampati: II  TM Distance: >3 FB Neck ROM: Full    Dental no notable dental hx. (+) Teeth Intact, Dental Advisory Given   Pulmonary neg pulmonary ROS   Pulmonary exam normal breath sounds clear to auscultation       Cardiovascular negative cardio ROS Normal cardiovascular exam Rhythm:Regular Rate:Normal     Neuro/Psych    GI/Hepatic negative GI ROS, Neg liver ROS,,,  Endo/Other  negative endocrine ROS    Renal/GU negative Renal ROS     Musculoskeletal   Abdominal   Peds  Hematology Lab Results      Component                Value               Date                      WBC                      9.6                 07/29/2023                HGB                      11.9 (L)            07/29/2023                HCT                      38.0                07/29/2023                MCV                      68.6 (L)            07/29/2023                PLT                      205                 07/29/2023              Anesthesia Other Findings   Reproductive/Obstetrics (+) Pregnancy                             Anesthesia Physical Anesthesia Plan  ASA: 2  Anesthesia Plan: Epidural   Post-op Pain Management:    Induction:   PONV Risk Score and Plan:   Airway Management Planned:   Additional Equipment:   Intra-op Plan:   Post-operative Plan:   Informed Consent: I have reviewed the patients History and Physical, chart, labs and discussed the procedure including the risks, benefits and alternatives for the proposed anesthesia with the patient or authorized representative who has indicated his/her understanding and acceptance.       Plan Discussed with:   Anesthesia Plan Comments:  (38.6 wk G2P1 for  LEA)  Anesthesia Quick Evaluation

## 2023-07-29 NOTE — Progress Notes (Signed)
 Just received epidural, still with some cramping BP 138/86 (BP Location: Right Arm)   Pulse 97   Temp 97.9 F (36.6 C) (Oral)   Resp 19   Ht 5\' 4"  (1.626 m)   Wt 88.5 kg   SpO2 94%   BMI 33.49 kg/m  CE per RN 6/80/-2 TOCO q25min  Plan to re-examine in 2hrs, can AROM at that time >4hrs abx

## 2023-07-29 NOTE — Anesthesia Procedure Notes (Signed)
 Epidural Patient location during procedure: OB Start time: 07/29/2023 11:00 PM End time: 07/29/2023 11:20 PM  Staffing Anesthesiologist: Rosalita Combe, MD Performed: anesthesiologist   Preanesthetic Checklist Completed: patient identified, IV checked, site marked, risks and benefits discussed, surgical consent, monitors and equipment checked, pre-op evaluation and timeout performed  Epidural Patient position: sitting Prep: DuraPrep and site prepped and draped Patient monitoring: continuous pulse ox and blood pressure Approach: midline Location: L3-L4 Injection technique: LOR air  Needle:  Needle type: Tuohy  Needle gauge: 17 G Needle length: 9 cm and 9 Needle insertion depth: 7 cm Catheter type: closed end flexible Catheter size: 19 Gauge Catheter at skin depth: 13 cm Test dose: negative  Assessment Events: blood not aspirated, no cerebrospinal fluid, injection not painful, no injection resistance, no paresthesia and negative IV test  Additional Notes Patient identified. Risks/Benefits/Options discussed with patient including but not limited to bleeding, infection, nerve damage, paralysis, failed block, incomplete pain control, headache, blood pressure changes, nausea, vomiting, reactions to medication both or allergic, itching and postpartum back pain. Confirmed with bedside nurse the patient's most recent platelet count. Confirmed with patient that they are not currently taking any anticoagulation, have any bleeding history or any family history of bleeding disorders. Patient expressed understanding and wished to proceed. All questions were answered. Sterile technique was used throughout the entire procedure. Please see nursing notes for vital signs. Test dose was given through epidural needle and negative prior to continuing to dose epidural or start infusion. Warning signs of high block given to the patient including shortness of breath, tingling/numbness in hands, complete  motor block, or any concerning symptoms with instructions to call for help. Patient was given instructions on fall risk and not to get out of bed. All questions and concerns addressed with instructions to call with any issues. 3 Attempt (S)  . Patient tolerated procedure well.

## 2023-07-30 ENCOUNTER — Encounter (HOSPITAL_COMMUNITY): Payer: Self-pay | Admitting: Student

## 2023-07-30 ENCOUNTER — Inpatient Hospital Stay (HOSPITAL_COMMUNITY)

## 2023-07-30 ENCOUNTER — Inpatient Hospital Stay (HOSPITAL_COMMUNITY): Admission: AD | Admit: 2023-07-30 | Source: Home / Self Care | Admitting: Student

## 2023-07-30 DIAGNOSIS — Z3A39 39 weeks gestation of pregnancy: Secondary | ICD-10-CM | POA: Diagnosis not present

## 2023-07-30 LAB — CBC
HCT: 33.6 % — ABNORMAL LOW (ref 36.0–46.0)
Hemoglobin: 10.4 g/dL — ABNORMAL LOW (ref 12.0–15.0)
MCH: 21.2 pg — ABNORMAL LOW (ref 26.0–34.0)
MCHC: 31 g/dL (ref 30.0–36.0)
MCV: 68.6 fL — ABNORMAL LOW (ref 80.0–100.0)
Platelets: 177 10*3/uL (ref 150–400)
RBC: 4.9 MIL/uL (ref 3.87–5.11)
RDW: 18.1 % — ABNORMAL HIGH (ref 11.5–15.5)
WBC: 14.7 10*3/uL — ABNORMAL HIGH (ref 4.0–10.5)
nRBC: 0 % (ref 0.0–0.2)

## 2023-07-30 LAB — TYPE AND SCREEN
ABO/RH(D): O POS
Antibody Screen: NEGATIVE

## 2023-07-30 LAB — RPR: RPR Ser Ql: NONREACTIVE

## 2023-07-30 MED ORDER — ONDANSETRON HCL 4 MG/2ML IJ SOLN: 4.0000 mg | INTRAMUSCULAR | Status: DC | PRN

## 2023-07-30 MED ORDER — METHYLERGONOVINE MALEATE 0.2 MG/ML IJ SOLN
0.2000 mg | Freq: Once | INTRAMUSCULAR | Status: AC
  Administered 2023-07-30: 0.2 mg via INTRAMUSCULAR
  Filled 2023-07-30: qty 1

## 2023-07-30 MED ORDER — ZOLPIDEM TARTRATE 5 MG PO TABS: 5.0000 mg | ORAL_TABLET | Freq: Every evening | ORAL | Status: DC | PRN

## 2023-07-30 MED ORDER — SENNOSIDES-DOCUSATE SODIUM 8.6-50 MG PO TABS
2.0000 | ORAL_TABLET | Freq: Every day | ORAL | Status: DC
  Administered 2023-07-31: 2 via ORAL
  Filled 2023-07-30: qty 2

## 2023-07-30 MED ORDER — ONDANSETRON HCL 4 MG PO TABS: 4.0000 mg | ORAL_TABLET | ORAL | Status: DC | PRN

## 2023-07-30 MED ORDER — IBUPROFEN 600 MG PO TABS
600.0000 mg | ORAL_TABLET | Freq: Four times a day (QID) | ORAL | Status: DC
  Administered 2023-07-30 – 2023-07-31 (×6): 600 mg via ORAL
  Filled 2023-07-30 (×6): qty 1

## 2023-07-30 MED ORDER — SIMETHICONE 80 MG PO CHEW: 80.0000 mg | CHEWABLE_TABLET | ORAL | Status: DC | PRN

## 2023-07-30 MED ORDER — COCONUT OIL OIL: 1.0000 | TOPICAL_OIL | Status: DC | PRN

## 2023-07-30 MED ORDER — ACETAMINOPHEN 325 MG PO TABS
650.0000 mg | ORAL_TABLET | ORAL | Status: DC | PRN
  Administered 2023-07-30: 650 mg via ORAL
  Filled 2023-07-30: qty 2

## 2023-07-30 MED ORDER — PRENATAL MULTIVITAMIN CH
1.0000 | ORAL_TABLET | Freq: Every day | ORAL | Status: DC
  Administered 2023-07-30 – 2023-07-31 (×2): 1 via ORAL
  Filled 2023-07-30 (×2): qty 1

## 2023-07-30 MED ORDER — DIBUCAINE (PERIANAL) 1 % EX OINT: 1.0000 | TOPICAL_OINTMENT | CUTANEOUS | Status: DC | PRN

## 2023-07-30 MED ORDER — TRANEXAMIC ACID-NACL 1000-0.7 MG/100ML-% IV SOLN
1000.0000 mg | INTRAVENOUS | Status: AC
  Administered 2023-07-30: 1000 mg via INTRAVENOUS
  Filled 2023-07-30: qty 100

## 2023-07-30 MED ORDER — TETANUS-DIPHTH-ACELL PERTUSSIS 5-2.5-18.5 LF-MCG/0.5 IM SUSY
0.5000 mL | PREFILLED_SYRINGE | Freq: Once | INTRAMUSCULAR | Status: DC
Start: 2023-07-31 — End: 2023-07-31

## 2023-07-30 MED ORDER — WITCH HAZEL-GLYCERIN EX PADS: 1.0000 | MEDICATED_PAD | CUTANEOUS | Status: DC | PRN

## 2023-07-30 MED ORDER — DIPHENHYDRAMINE HCL 25 MG PO CAPS: 25.0000 mg | ORAL_CAPSULE | Freq: Four times a day (QID) | ORAL | Status: DC | PRN

## 2023-07-30 MED ORDER — BENZOCAINE-MENTHOL 20-0.5 % EX AERO: 1.0000 | INHALATION_SPRAY | CUTANEOUS | Status: DC | PRN

## 2023-07-30 NOTE — Plan of Care (Signed)
 Problem: Education: Goal: Knowledge of General Education information will improve Description: Including pain rating scale, medication(s)/side effects and non-pharmacologic comfort measures 07/30/2023 0530 by Larayne Platter, RN Outcome: Completed/Met 07/30/2023 0013 by Larayne Platter, RN Outcome: Progressing   Problem: Health Behavior/Discharge Planning: Goal: Ability to manage health-related needs will improve 07/30/2023 0530 by Larayne Platter, RN Outcome: Completed/Met 07/30/2023 0013 by Larayne Platter, RN Outcome: Progressing   Problem: Clinical Measurements: Goal: Ability to maintain clinical measurements within normal limits will improve 07/30/2023 0530 by Larayne Platter, RN Outcome: Completed/Met 07/30/2023 0013 by Larayne Platter, RN Outcome: Progressing Goal: Will remain free from infection 07/30/2023 0530 by Larayne Platter, RN Outcome: Completed/Met 07/30/2023 0013 by Larayne Platter, RN Outcome: Progressing Goal: Diagnostic test results will improve 07/30/2023 0530 by Larayne Platter, RN Outcome: Completed/Met 07/30/2023 0013 by Larayne Platter, RN Outcome: Progressing Goal: Respiratory complications will improve 07/30/2023 0530 by Larayne Platter, RN Outcome: Completed/Met 07/30/2023 0013 by Larayne Platter, RN Outcome: Progressing Goal: Cardiovascular complication will be avoided 07/30/2023 0530 by Ayelen Sciortino D, RN Outcome: Completed/Met 07/30/2023 0013 by Larayne Platter, RN Outcome: Progressing   Problem: Activity: Goal: Risk for activity intolerance will decrease 07/30/2023 0530 by Larayne Platter, RN Outcome: Completed/Met 07/30/2023 0013 by Larayne Platter, RN Outcome: Progressing   Problem: Nutrition: Goal: Adequate nutrition will be maintained 07/30/2023 0530 by Larayne Platter, RN Outcome: Completed/Met 07/30/2023 0013 by Larayne Platter, RN Outcome: Progressing   Problem:  Coping: Goal: Level of anxiety will decrease 07/30/2023 0530 by Larayne Platter, RN Outcome: Completed/Met 07/30/2023 0013 by Larayne Platter, RN Outcome: Progressing   Problem: Elimination: Goal: Will not experience complications related to bowel motility 07/30/2023 0530 by Larayne Platter, RN Outcome: Completed/Met 07/30/2023 0013 by Larayne Platter, RN Outcome: Progressing Goal: Will not experience complications related to urinary retention 07/30/2023 0530 by Larayne Platter, RN Outcome: Completed/Met 07/30/2023 0013 by Larayne Platter, RN Outcome: Progressing   Problem: Pain Managment: Goal: General experience of comfort will improve and/or be controlled 07/30/2023 0530 by Larayne Platter, RN Outcome: Completed/Met 07/30/2023 0013 by Larayne Platter, RN Outcome: Progressing   Problem: Safety: Goal: Ability to remain free from injury will improve 07/30/2023 0530 by Larayne Platter, RN Outcome: Completed/Met 07/30/2023 0013 by Larayne Platter, RN Outcome: Progressing   Problem: Skin Integrity: Goal: Risk for impaired skin integrity will decrease 07/30/2023 0530 by Larayne Platter, RN Outcome: Completed/Met 07/30/2023 0013 by Larayne Platter, RN Outcome: Progressing   Problem: Education: Goal: Knowledge of Childbirth will improve 07/30/2023 0530 by Larayne Platter, RN Outcome: Completed/Met 07/30/2023 0013 by Larayne Platter, RN Outcome: Progressing Goal: Ability to make informed decisions regarding treatment and plan of care will improve 07/30/2023 0530 by Larayne Platter, RN Outcome: Completed/Met 07/30/2023 0013 by Larayne Platter, RN Outcome: Progressing Goal: Ability to state and carry out methods to decrease the pain will improve 07/30/2023 0530 by Larayne Platter, RN Outcome: Completed/Met 07/30/2023 0013 by Larayne Platter, RN Outcome: Progressing Goal: Individualized Educational Video(s) 07/30/2023  0530 by Larayne Platter, RN Outcome: Completed/Met 07/30/2023 0013 by Larayne Platter, RN Outcome: Progressing   Problem: Coping: Goal: Ability to verbalize concerns and feelings about labor and delivery will improve 07/30/2023 0530 by Larayne Platter, RN Outcome: Completed/Met 07/30/2023 0013 by Larayne Platter, RN Outcome: Progressing   Problem: Life Cycle: Goal: Ability to make normal progression  through stages of labor will improve 07/30/2023 0530 by Kaspar Albornoz D, RN Outcome: Completed/Met 07/30/2023 0013 by Larayne Platter, RN Outcome: Progressing Goal: Ability to effectively push during vaginal delivery will improve 07/30/2023 0530 by Larayne Platter, RN Outcome: Completed/Met 07/30/2023 0013 by Larayne Platter, RN Outcome: Progressing   Problem: Role Relationship: Goal: Will demonstrate positive interactions with the child 07/30/2023 0530 by Larayne Platter, RN Outcome: Completed/Met 07/30/2023 0013 by Larayne Platter, RN Outcome: Progressing   Problem: Safety: Goal: Risk of complications during labor and delivery will decrease 07/30/2023 0530 by Larayne Platter, RN Outcome: Completed/Met 07/30/2023 0013 by Larayne Platter, RN Outcome: Progressing   Problem: Pain Management: Goal: Relief or control of pain from uterine contractions will improve 07/30/2023 0530 by Larayne Platter, RN Outcome: Completed/Met 07/30/2023 0013 by Larayne Platter, RN Outcome: Progressing

## 2023-07-30 NOTE — Progress Notes (Signed)
 Post Partum Day 0 Subjective: Fatigued. Pain controlled, lochia normal  Objective: Blood pressure 110/73, pulse 94, temperature 99.4 F (37.4 C), temperature source Axillary, resp. rate 18, height 5\' 4"  (1.626 m), weight 88.5 kg, SpO2 99%, unknown if currently breastfeeding.  Physical Exam:  General: alert, cooperative, and no distress DVT Evaluation: No significant calf/ankle edema.  Recent Labs    07/29/23 2211 07/30/23 0844  HGB 11.9* 10.4*  HCT 38.0 33.6*    Assessment/Plan: 28 yo G2 now P2 following NSVD early this morning - PP: Doing well, continue routine care - Elevated BP while pushing normotensive since delivery   LOS: 1 day   Leanne Pronto, DO 07/30/2023, 11:46 AM

## 2023-07-30 NOTE — Progress Notes (Addendum)
 Comfortable s/p epidural BP 129/85   Pulse 99   Temp 97.9 F (36.6 C) (Oral)   Resp 18   Ht 5\' 4"  (1.626 m)   Wt 88.5 kg   SpO2 99%   BMI 33.49 kg/m  AROM @ 0239, ant lip, 0 station Light mec Cat 1 tracing, early decel, mod var, TOCO q3-48m  Anticipate SVD. Ampicillin for GBS ppx  ADDENDUM - pt now complete/+1, good practice push

## 2023-07-30 NOTE — Plan of Care (Signed)

## 2023-07-30 NOTE — Anesthesia Postprocedure Evaluation (Signed)
 Anesthesia Post Note  Patient: Carrie Boyd  Procedure(s) Performed: AN AD HOC LABOR EPIDURAL     Patient location during evaluation: Mother Baby Anesthesia Type: Epidural Level of consciousness: awake and alert Pain management: pain level controlled Vital Signs Assessment: post-procedure vital signs reviewed and stable Respiratory status: spontaneous breathing, nonlabored ventilation and respiratory function stable Cardiovascular status: stable Postop Assessment: no headache, no backache, epidural receding, no apparent nausea or vomiting, patient able to bend at knees and able to ambulate Anesthetic complications: no   No notable events documented.  Last Vitals:  Vitals:   07/30/23 1134 07/30/23 1533  BP: 110/73 118/85  Pulse: 94 90  Resp: 18 16  Temp: 37.4 C 37.2 C  SpO2: 99% 97%    Last Pain:  Vitals:   07/30/23 1533  TempSrc: Oral  PainSc:    Pain Goal:                   Sheyna Pettibone Hristova

## 2023-07-31 MED ORDER — IBUPROFEN 600 MG PO TABS: 600.0000 mg | ORAL_TABLET | Freq: Four times a day (QID) | ORAL | Status: DC

## 2023-07-31 MED ORDER — ACETAMINOPHEN 325 MG PO TABS: 650.0000 mg | ORAL_TABLET | ORAL | Status: DC | PRN

## 2023-07-31 MED ORDER — IBUPROFEN 600 MG PO TABS: 600.0000 mg | ORAL_TABLET | Freq: Four times a day (QID) | ORAL | 0 refills | Status: AC

## 2023-07-31 MED ORDER — ACETAMINOPHEN 325 MG PO TABS: 650.0000 mg | ORAL_TABLET | Freq: Four times a day (QID) | ORAL | 0 refills | Status: AC | PRN

## 2023-07-31 NOTE — Progress Notes (Signed)
 Post Partum Day 1 Subjective: Tired appearing. Complaining of some uterine cramping. Lochia appropriate. Ambulating, voiding, tolerating PO without any problems. Bottlefeeding. Initially indicated that she would like to stay one more night, but then requested discharge.  Objective:    07/31/2023    5:41 AM 07/30/2023    7:34 PM 07/30/2023    3:33 PM  Vitals with BMI  Systolic 116 116 161  Diastolic 77 76 85  Pulse 73 86 90     Physical Exam:  General: no acute distress Pulm: normal work of breathing on room air Card: well perfused, no e/o DVT Abd: soft, non-tender. Fundus firm below umbilicus MSK: normal ROM Neuro: no focal deficits, oriented x3 Psych: normal mood, normal thought   Recent Labs    07/29/23 2211 07/30/23 0844  HGB 11.9* 10.4*  HCT 38.0 33.6*    Assessment/Plan: 28 yo G2 now P2 following NSVD after presenting in labor - PPD1: Doing well, continue routine care - Elevated BP: likely erroneous, occurred while pushing, normotensive since delivery  Dispo: Anticipate discharge today.   LOS: 2 days   Alene Ana, MD 07/31/2023, 3:30 PM

## 2023-07-31 NOTE — Discharge Summary (Signed)
 Postpartum Discharge Summary  Date of Service updated 5/14-5/16/25     Patient Name: Carrie Boyd DOB: [birth year] MRN: [id number]  Date of admission: 07/29/2023 Delivery date:07/30/2023 Delivering provider: SHIVAJI, SHANTI M Date of discharge: 07/31/2023  Admitting diagnosis: Indication for care in labor or delivery [O75.9] Intrauterine pregnancy: [redacted]w[redacted]d     Secondary diagnosis:  Principal Problem:   Indication for care in labor or delivery  Additional problems: none    Discharge diagnosis: Term Pregnancy Delivered                                              Post partum procedures:none Augmentation: none Complications: None  Hospital course: Onset of Labor With Vaginal Delivery      28 y.o. yo G2P2002 at [redacted]w[redacted]d was admitted in Active Labor on 07/29/2023. Labor course was complicated by nothing  Membrane Rupture Time/Date: 2:39 AM,07/30/2023  Delivery Method:Vaginal, Spontaneous Episiotomy: None Lacerations:  2nd degree;Perineal Patient had a postpartum course complicated by nothing.  She is ambulating, tolerating a regular diet, passing flatus, and urinating well. Patient is discharged home in stable condition on 07/31/23.  Newborn Data: Birth date:07/30/2023 Birth time:3:47 AM Gender:Female Living status:Living Apgars:8 ,9  Weight:3840 g  Magnesium  Sulfate received: No BMZ received: No Rhophylac:No MMR:No T-DaP:no  Transfusion:No Immunizations administered: Immunization History  Administered Date(s) Administered   Influenza,inj,Quad PF,6+ Mos 12/29/2015    Physical exam  Vitals:   07/30/23 1134 07/30/23 1533 07/30/23 1934 07/31/23 0541  BP: 110/73 118/85 116/76 116/77  Pulse: 94 90 86 73  Resp: 18 16 16 16   Temp: 99.4 F (37.4 C) 98.9 F (37.2 C) 99 F (37.2 C) 98.3 F (36.8 C)  TempSrc: Axillary Oral Oral Oral  SpO2: 99% 97% 99% 98%  Weight:      Height:       General: alert, cooperative, and no distress Lochia: appropriate Uterine Fundus:  firm DVT Evaluation: No evidence of DVT seen on physical exam. Labs: Lab Results  Component Value Date   WBC 14.7 (H) 07/30/2023   HGB 10.4 (L) 07/30/2023   HCT 33.6 (L) 07/30/2023   MCV 68.6 (L) 07/30/2023   PLT 177 07/30/2023      Latest Ref Rng & Units 05/09/2021   12:08 AM  CMP  Glucose 70 - 99 mg/dL 782   BUN 6 - 20 mg/dL 14   Creatinine 9.56 - 1.00 mg/dL 2.13   Sodium 086 - 578 mmol/L 135   Potassium 3.5 - 5.1 mmol/L 3.5   Chloride 98 - 111 mmol/L 106   CO2 22 - 32 mmol/L 22   Calcium 8.9 - 10.3 mg/dL 9.1    Edinburgh Score:    07/31/2023    4:45 AM  Edinburgh Postnatal Depression Scale Screening Tool  I have been able to laugh and see the funny side of things. 2  I have looked forward with enjoyment to things. 1  I have blamed myself unnecessarily when things went wrong. 0  I have been anxious or worried for no good reason. 2  I have felt scared or panicky for no good reason. 1  Things have been getting on top of me. 1  I have been so unhappy that I have had difficulty sleeping. 2  I have felt sad or miserable. 1  I have been so unhappy that I have been crying. 0  The thought of harming myself has occurred to me. 0  Edinburgh Postnatal Depression Scale Total 10      After visit meds:  Allergies as of 07/31/2023   No Known Allergies      Medication List     TAKE these medications    acetaminophen  325 MG tablet Commonly known as: Tylenol  Take 2 tablets (650 mg total) by mouth every 6 (six) hours as needed (for pain scale < 4).   ibuprofen  600 MG tablet Commonly known as: ADVIL  Take 1 tablet (600 mg total) by mouth every 6 (six) hours.         Discharge home in stable condition Infant Feeding: Bottle Infant Disposition:home with mother Discharge instruction: per After Visit Summary and Postpartum booklet. Activity: Advance as tolerated. Pelvic rest for 6 weeks.  Diet: routine diet Anticipated Birth Control: Unsure Postpartum Appointment:6  weeks Additional Postpartum F/U: routine Future Appointments:No future appointments. Follow up Visit:  Follow-up Information     [facility]. Schedule an appointment as soon as possible for a visit in 6 week(s).   Contact information: [address]  [address] [place] [address] [phone]                     07/31/2023 Alene Ana, MD

## 2023-07-31 NOTE — Social Work (Signed)
 CSW received consult for Edinburgh 10.  CSW met with Carrie Boyd to offer support and complete assessment.    CSW met with Carrie Boyd at bedside, introduced role and the reason for the visit. Carrie Boyd was lying in bed, appeared calm and was receptive to complete an assessment. CSW observed FOB (Loudy Kdam) holding and bonding with the infant at bedside. CSW congratulated Carrie Boyd and FOB on their baby boy, Carrie Boyd. CSW offered Carrie Boyd privacy, and Carrie Boyd permitted CSW to share all information with FOB present. CSW asked Carrie Boyd how she had been doing. Carrie Boyd reported feeling fine but tired. CSW discussed the [place] and inquired how Carrie Boyd had felt the last seven days. Carrie Boyd mentioned feeling excited and nervous about the birth which she felt were all normal emotions. CSW validated Carrie Boyd feelings and agreed her emotions were normal. Carrie Boyd also shared that she has a five-year-old daughter and expressed concern about caring for two children. CSW acknowledged Carrie Boyd concerns and inquired about her support system. Carrie Boyd identified her spouse as her primary support, and that he would continue to be supportive. CSW encouraged Carrie Boyd to allow her support system to assist her.  CSW inquired if Carrie Boyd had mental health history. Carrie Boyd denied mental health history, however, she reported having experienced postpartum depression/anxiety with her older child. She mentioned feeling sleep deprived for two months and the symptoms eventually subsided. Carrie Boyd denied having current postpartum depression/anxiety symptoms. CSW discussed good sleep hygiene and encouraged her prioritize rest during the postpartum period. CSW provided education regarding the baby blues period vs. perinatal mood disorders, discussed treatment and gave resources for mental health follow up if concerns arise. CSW also recommended Carrie Boyd complete a self-evaluation during the postpartum time period using the New Mom Checklist from Postpartum Progress and encouraged Carrie Boyd to contact a medical professional if symptoms are noted  at any time. CSW assessed Carrie Boyd for safety. Carrie Boyd denied thoughts of harming self and others.  Carrie Boyd reported that she had essential baby items including a crib and car seat. CSW provided review of Sudden Infant Death Syndrome (SIDS) precautions. Carrie Boyd and FOB reported they understood. CSW asked Carrie Boyd if she had chosen pediatrician. Carrie Boyd reported [facility]. CSW assessed Carrie Boyd for additional needs. Carrie Boyd reported none.  CSW identifies no further need for intervention and no barriers to discharge at this time.  Nickolas Barr, MSW, LCSW Clinical Social Worker  [phone] 07/31/2023  11:57 AM

## 2023-08-12 ENCOUNTER — Telehealth (HOSPITAL_COMMUNITY): Payer: Self-pay

## 2023-08-12 NOTE — Telephone Encounter (Signed)
 08/12/2023 1200  Name: Carrie Boyd MRN: [id number] DOB: 09-04-1995  Reason for Call:  Transition of Care Hospital Discharge Call  Contact Status: Patient Contact Status: Message  Language assistant needed:          Follow-Up Questions:    Dimple Francis Postnatal Depression Scale:  In the Past 7 Days:    PHQ2-9 Depression Scale:     Discharge Follow-up:    Post-discharge interventions: NA  Signature  [place]ell Guild

## 2023-08-13 ENCOUNTER — Inpatient Hospital Stay (HOSPITAL_COMMUNITY)

## 2023-09-11 DIAGNOSIS — R300 Dysuria: Secondary | ICD-10-CM | POA: Diagnosis not present

## 2023-09-11 DIAGNOSIS — Z1331 Encounter for screening for depression: Secondary | ICD-10-CM | POA: Diagnosis not present

## 2023-09-11 DIAGNOSIS — Z1389 Encounter for screening for other disorder: Secondary | ICD-10-CM | POA: Diagnosis not present

## 2023-09-11 DIAGNOSIS — N898 Other specified noninflammatory disorders of vagina: Secondary | ICD-10-CM | POA: Diagnosis not present

## 2023-09-11 DIAGNOSIS — Z3202 Encounter for pregnancy test, result negative: Secondary | ICD-10-CM | POA: Diagnosis not present

## 2023-09-11 DIAGNOSIS — Z3046 Encounter for surveillance of implantable subdermal contraceptive: Secondary | ICD-10-CM | POA: Diagnosis not present
